# Patient Record
Sex: Female | Born: 1955 | Race: Black or African American | Hispanic: No | Marital: Married | Smoking: Former smoker
Health system: Southern US, Community
[De-identification: ages and names within clinical notes are randomized; demographics above are authoritative.]

## PROBLEM LIST (undated history)

## (undated) DIAGNOSIS — I10 Essential (primary) hypertension: Secondary | ICD-10-CM

## (undated) DIAGNOSIS — K219 Gastro-esophageal reflux disease without esophagitis: Secondary | ICD-10-CM

## (undated) DIAGNOSIS — E119 Type 2 diabetes mellitus without complications: Secondary | ICD-10-CM

## (undated) DIAGNOSIS — M1990 Unspecified osteoarthritis, unspecified site: Secondary | ICD-10-CM

## (undated) DIAGNOSIS — D869 Sarcoidosis, unspecified: Secondary | ICD-10-CM

## (undated) DIAGNOSIS — I259 Chronic ischemic heart disease, unspecified: Secondary | ICD-10-CM

## (undated) HISTORY — DX: Chronic ischemic heart disease, unspecified: I25.9

## (undated) HISTORY — PX: COLONOSCOPY: SHX174

## (undated) HISTORY — PX: CARDIAC CATHETERIZATION: SHX172

## (undated) HISTORY — DX: Essential (primary) hypertension: I10

## (undated) HISTORY — DX: Sarcoidosis, unspecified: D86.9

---

## 1999-10-20 ENCOUNTER — Encounter: Admission: RE | Admit: 1999-10-20 | Discharge: 1999-10-20 | Payer: Self-pay | Admitting: Internal Medicine

## 2004-05-29 ENCOUNTER — Ambulatory Visit: Payer: Self-pay | Admitting: Family Medicine

## 2004-06-11 ENCOUNTER — Ambulatory Visit: Payer: Self-pay | Admitting: *Deleted

## 2004-06-25 ENCOUNTER — Ambulatory Visit: Payer: Self-pay | Admitting: *Deleted

## 2004-06-25 ENCOUNTER — Ambulatory Visit (HOSPITAL_COMMUNITY): Admission: RE | Admit: 2004-06-25 | Discharge: 2004-06-25 | Payer: Self-pay | Admitting: *Deleted

## 2004-07-01 ENCOUNTER — Ambulatory Visit: Payer: Self-pay | Admitting: *Deleted

## 2004-10-08 ENCOUNTER — Ambulatory Visit: Payer: Self-pay | Admitting: Internal Medicine

## 2004-11-11 ENCOUNTER — Ambulatory Visit: Payer: Self-pay | Admitting: Family Medicine

## 2005-01-04 ENCOUNTER — Ambulatory Visit: Payer: Self-pay | Admitting: Internal Medicine

## 2005-02-02 ENCOUNTER — Ambulatory Visit: Payer: Self-pay | Admitting: Internal Medicine

## 2005-06-24 ENCOUNTER — Ambulatory Visit: Payer: Self-pay | Admitting: Family Medicine

## 2005-07-01 ENCOUNTER — Ambulatory Visit: Payer: Self-pay | Admitting: Family Medicine

## 2005-07-07 ENCOUNTER — Ambulatory Visit: Payer: Self-pay | Admitting: Family Medicine

## 2005-07-14 ENCOUNTER — Ambulatory Visit: Payer: Self-pay | Admitting: Family Medicine

## 2005-10-11 ENCOUNTER — Ambulatory Visit: Payer: Self-pay | Admitting: Internal Medicine

## 2006-10-03 ENCOUNTER — Ambulatory Visit: Payer: Self-pay | Admitting: Family Medicine

## 2006-10-28 ENCOUNTER — Ambulatory Visit: Payer: Self-pay | Admitting: Family Medicine

## 2007-05-17 DIAGNOSIS — J310 Chronic rhinitis: Secondary | ICD-10-CM | POA: Insufficient documentation

## 2007-05-17 DIAGNOSIS — I2510 Atherosclerotic heart disease of native coronary artery without angina pectoris: Secondary | ICD-10-CM | POA: Insufficient documentation

## 2007-05-17 DIAGNOSIS — D869 Sarcoidosis, unspecified: Secondary | ICD-10-CM

## 2007-06-14 ENCOUNTER — Ambulatory Visit: Payer: Self-pay | Admitting: Internal Medicine

## 2007-06-14 DIAGNOSIS — M2550 Pain in unspecified joint: Secondary | ICD-10-CM | POA: Insufficient documentation

## 2007-06-14 LAB — CONVERTED CEMR LAB
ALT: 12 units/L (ref 0–35)
AST: 17 units/L (ref 0–37)
Albumin: 4.1 g/dL (ref 3.5–5.2)
Alkaline Phosphatase: 67 units/L (ref 39–117)
BUN: 10 mg/dL (ref 6–23)
Bilirubin, Direct: 0.2 mg/dL (ref 0.0–0.3)
CO2: 31 meq/L (ref 19–32)
Calcium: 9.2 mg/dL (ref 8.4–10.5)
Chloride: 104 meq/L (ref 96–112)
Creatinine, Ser: 0.8 mg/dL (ref 0.4–1.2)
GFR calc Af Amer: 97 mL/min
GFR calc non Af Amer: 80 mL/min
Glucose, Bld: 96 mg/dL (ref 70–99)
Potassium: 3.6 meq/L (ref 3.5–5.1)
Sed Rate: 17 mm/hr (ref 0–25)
Sodium: 141 meq/L (ref 135–145)
Total Bilirubin: 0.8 mg/dL (ref 0.3–1.2)
Total Protein: 7.7 g/dL (ref 6.0–8.3)

## 2007-08-01 ENCOUNTER — Ambulatory Visit: Payer: Self-pay | Admitting: Internal Medicine

## 2007-08-02 LAB — CONVERTED CEMR LAB
ALT: 15 units/L (ref 0–35)
AST: 16 units/L (ref 0–37)
Albumin: 3.7 g/dL (ref 3.5–5.2)

## 2008-11-04 ENCOUNTER — Ambulatory Visit: Payer: Self-pay | Admitting: Internal Medicine

## 2008-11-04 LAB — CONVERTED CEMR LAB
Albumin: 3.8 g/dL (ref 3.5–5.2)
Alkaline Phosphatase: 44 units/L (ref 39–117)
BUN: 8 mg/dL (ref 6–23)
Basophils Absolute: 0 10*3/uL (ref 0.0–0.1)
Bilirubin, Direct: 0.2 mg/dL (ref 0.0–0.3)
CO2: 32 meq/L (ref 19–32)
Calcium: 9.1 mg/dL (ref 8.4–10.5)
Creatinine, Ser: 0.7 mg/dL (ref 0.4–1.2)
Eosinophils Absolute: 0.1 10*3/uL (ref 0.0–0.7)
Glucose, Bld: 103 mg/dL — ABNORMAL HIGH (ref 70–99)
Lymphocytes Relative: 13.2 % (ref 12.0–46.0)
MCHC: 34.6 g/dL (ref 30.0–36.0)
Neutrophils Relative %: 80.6 % — ABNORMAL HIGH (ref 43.0–77.0)
RDW: 12.8 % (ref 11.5–14.6)

## 2009-01-07 ENCOUNTER — Ambulatory Visit: Payer: Self-pay | Admitting: Internal Medicine

## 2009-04-11 ENCOUNTER — Ambulatory Visit: Payer: Self-pay | Admitting: Internal Medicine

## 2009-07-29 ENCOUNTER — Ambulatory Visit: Payer: Self-pay | Admitting: Internal Medicine

## 2009-07-30 LAB — CONVERTED CEMR LAB
ALT: 14 units/L (ref 0–35)
AST: 18 units/L (ref 0–37)
Albumin: 4 g/dL (ref 3.5–5.2)
CO2: 30 meq/L (ref 19–32)
Calcium: 9.1 mg/dL (ref 8.4–10.5)
GFR calc non Af Amer: 95.84 mL/min (ref >60)
Sed Rate: 8 mm/hr (ref 0–22)
Sodium: 143 meq/L (ref 135–145)
Total Protein: 7.1 g/dL (ref 6.0–8.3)

## 2009-09-10 ENCOUNTER — Ambulatory Visit: Payer: Self-pay | Admitting: Internal Medicine

## 2009-09-10 ENCOUNTER — Telehealth (INDEPENDENT_AMBULATORY_CARE_PROVIDER_SITE_OTHER): Payer: Self-pay | Admitting: *Deleted

## 2009-10-13 ENCOUNTER — Ambulatory Visit: Payer: Self-pay | Admitting: Internal Medicine

## 2009-11-12 ENCOUNTER — Ambulatory Visit: Payer: Self-pay | Admitting: Internal Medicine

## 2009-11-13 LAB — CONVERTED CEMR LAB
AST: 22 units/L (ref 0–37)
Alkaline Phosphatase: 45 units/L (ref 39–117)
Bilirubin, Direct: 0.1 mg/dL (ref 0.0–0.3)
Calcium: 9 mg/dL (ref 8.4–10.5)
GFR calc non Af Amer: 81.48 mL/min (ref >60)
Potassium: 4 meq/L (ref 3.5–5.1)
Sodium: 142 meq/L (ref 135–145)
Total Bilirubin: 0.4 mg/dL (ref 0.3–1.2)

## 2010-02-20 ENCOUNTER — Ambulatory Visit: Payer: Self-pay | Admitting: Internal Medicine

## 2010-02-23 ENCOUNTER — Telehealth (INDEPENDENT_AMBULATORY_CARE_PROVIDER_SITE_OTHER): Payer: Self-pay | Admitting: *Deleted

## 2010-02-23 LAB — CONVERTED CEMR LAB
ALT: 19 units/L (ref 0–35)
AST: 23 units/L (ref 0–37)
BUN: 9 mg/dL (ref 6–23)
CO2: 30 meq/L (ref 19–32)
Chloride: 104 meq/L (ref 96–112)
Creatinine, Ser: 0.8 mg/dL (ref 0.4–1.2)
Potassium: 4.2 meq/L (ref 3.5–5.1)
Sed Rate: 9 mm/hr (ref 0–22)
Total Protein: 6.7 g/dL (ref 6.0–8.3)

## 2010-04-08 ENCOUNTER — Ambulatory Visit: Payer: Self-pay | Admitting: Internal Medicine

## 2010-04-09 LAB — CONVERTED CEMR LAB
CO2: 35 meq/L — ABNORMAL HIGH (ref 19–32)
Calcium: 9.4 mg/dL (ref 8.4–10.5)
Creatinine, Ser: 0.8 mg/dL (ref 0.4–1.2)
GFR calc non Af Amer: 91.62 mL/min (ref >60)
Glucose, Bld: 95 mg/dL (ref 70–99)
Sodium: 144 meq/L (ref 135–145)

## 2010-05-27 ENCOUNTER — Ambulatory Visit: Payer: Self-pay | Admitting: Internal Medicine

## 2010-07-14 ENCOUNTER — Ambulatory Visit: Admit: 2010-07-14 | Payer: Self-pay | Admitting: Internal Medicine

## 2010-07-15 ENCOUNTER — Telehealth: Payer: Self-pay | Admitting: Internal Medicine

## 2010-07-26 LAB — CONVERTED CEMR LAB
ALT: 13 units/L (ref 0–35)
BUN: 9 mg/dL (ref 6–23)
Bilirubin, Direct: 0.1 mg/dL (ref 0.0–0.3)
CO2: 29 meq/L (ref 19–32)
GFR calc non Af Amer: 96.04 mL/min (ref >60)
Glucose, Bld: 94 mg/dL (ref 70–99)
Potassium: 3.1 meq/L — ABNORMAL LOW (ref 3.5–5.1)
Total Bilirubin: 1 mg/dL (ref 0.3–1.2)

## 2010-07-30 NOTE — Assessment & Plan Note (Signed)
Summary: Pulmonary/  f/u sarcoidosis, try 15 mg per day   Primary Provider/Referring Provider:  Nyland  CC:  6 wk followup on sarcoid. Pt states that her joint pain has improved since last seen.  Marland Kitchen  History of Present Illness: 55  yobf  remote smoker with sarcoidosis chronically that has been prednisone dependent since 1997.Marland Kitchen  Historically  when  tapers of prednisone below 10 mg daily she has a flare up of either arthritis or cough/sob.   2/09 last ov stopped plaquenil  ? why? when?  Nov 04, 2008 ov cc sev months right foot pain over heads of MT's worse with weight bearing worse as day goes on...on 10 mg pred one half daily sometimes joints ache and takes a whole and all better, can't tolerate nsaids due to gi upset, even advil.  no cough or sob  rec initiate plaquenil  January 07, 2009 ov taking plaquenil daily @ 200 mg. able to decrease prednisone down to 10 mg  daily with no flare of arthitis, cough, sob, occular or articular c/os  LFT's ok,  rec that she try one half on odd days and continue 200 mg daily of plaquenil  April 11, 2009 ov still on plaqenil and able to maintain prednisone 10 mg one half daily but occasionally misses dose and no change in symtpoms 3 mo follow up.   no ocular or articular symptoms or rash so try 10 mg one half daily  July 29, 2009 Followup.  Pt states that she is doing well and denies any complaints of arthritis, sob cough or increase rash. try every other day 10mg  one half.  September 10, 2009 6 wk followup after sev weeks on 5 mg qod   Pt states that her her breathing has been fine.  She c/.o occ dry cough and c/o increased pain in her joints and rash on right ankle.  rec double plaquenil 200mg   twice daily and reduce pred to 5 mg q 3 d  > rash resolved, cough gone, breath, joints achy but no worse  October 13, 2009  4 wk followup.  Pt states that her joint pain is the same- no better.    Nov 12, 2009 ov no change joints on or off prednisone, no cough or sob.    see page 2 February 20, 2010  cc  increased joint pain- esp in hands and lower back since stopped prednisone.  Breathing is fine and states no cough.    cannot take nsaids, no better with tylenol >   rec Prednisone 10 mg 2 daily until better, then one daily until next visit  Hold simvastatin (cholesterol medication) for the next 6 weeks Stopped plaquenil after convinced tingling and resolved.  April 08, 2010 ov cc joint pain resolved 100% on 20  and returned 50% on 10 mg daily off plaquenil and also now has " cramps" in back of thighs better  if stretch out or walk it out,  only one week seems worse at bedtime.  no cough or sob.  Pt denies any significant sore throat, dysphagia, itching, sneezing,  nasal congestion or excess secretions,  fever, chills, sweats, unintended wt loss, pleuritic or exertional cp, hempoptysis, change in activity tolerance  orthopnea pnd or leg swelling, no rash, articular or occular c/os  Current Medications (verified): 1)  Losartan Potassium 50 Mg Tabs (Losartan Potassium) .Marland Kitchen.. 1 Once Daily 2)  Womens One Daily   Tabs (Multiple Vitamins-Minerals) .... Once Daily 3)  Caltrate 600+d 600-400  Mg-Unit  Tabs (Calcium Carbonate-Vitamin D) .... Two Times A Day 4)  Alendronate Sodium 70 Mg  Tabs (Alendronate Sodium) .... Once Weekly On "Sundays 5)  Simvastatin 20 Mg Tabs (Simvastatin) .... Try Off Until All Aches Gone Not Taking 6)  Prilosec Otc 20 Mg Tbec (Omeprazole Magnesium) .... 1 Once Daily 7)  Diprolene 0.05 % Oint (Betamethasone Dipropionate Aug) .... Apply Daily, Not For Use On Face 8)  Bayer Childrens Aspirin 81 Mg Chew (Aspirin) .... One Daily 9)  Plaquenil 200 Mg Tabs (Hydroxychloroquine Sulfate) .... 1 Two Times A Day - Stopped Taking Due To Numbness 10)  Tylenol 325 Mg Tabs (Acetaminophen) .... Per Bottle Directions As Needed 11)  Prednisone 10 Mg  Tabs (Prednisone) .... 2 Daily Until Better Then 1 Daily  Allergies (verified): 1)  ! Sulfa 2)  !  Ibuprofen  Past History:  Past Medical History: IHD  -left heart catheter 4/94 and 95% LAD reduced to 10% with PCA  -R./S. Myoview negative for ischemia ejection fraction 67% 06/25/04 HBP SARCOIDOSIS.....................................................................................Wert    - Prednisone dep since 1997     - Plaquenil rx 12/08 > stopped ? when ? why    - Plaquenil restarted Nov 04, 2008 >  increase to 200 two times a day 09/10/09 > d/c  02/23/10 100% resolved    - PFT's January 07, 2009 FVC 97% DLC0 66%    - Try off prednisone October 13, 2009 > worse aches February 20, 2010 so restarted   Vital Signs:  Patient profile:   55 year old female female Weight:      201.56 pounds O2 Sat:      98 % on Room air Temp:     98.0 degrees F oral Pulse rate:   66 / minute BP sitting:   118 / 82  (left arm)  Vitals Entered By: Leslie Raskin (April 08, 2010 9:28 AM)  O2 Flow:  Room air  Physical Exam  Additional Exam:  wt 205 Nov 04, 2008   > 212 October 13, 2009 > 206 Nov 12, 2009 > 203 February 20, 2010 > 201 April 08, 2010  Ambulatory healthy obese black female in no acute distress. HEENT: nl dentition, turbinates, and orophanx. Nl external ear canals without cough reflex Neck without JVD/Nodes/TM Lungs clear to A and P bilaterally without cough on insp or exp maneuvers RRR no s3 or murmur or increase in P2 Abd soft and benign with nl excursion in the supine position. No bruits or organomegaly Ext warm without calf tenderness, cyanosis clubbing or edema Skin warm and dry with no residual rash MS  nl gait, no joint restrictions or swelling noted     Sodium                    144 mEq/L                   135-145   Potassium                 4.1 mEq/L                   3.5-5.1   Chloride                  103 mEq/L                   96" -112   Carbon Dioxide       [H]  35 mEq/L  19-32   Glucose                   95 mg/dL                    11-91   BUN                        11 mg/dL                    4-78   Creatinine                0.8 mg/dL                   2.9-5.6   Calcium                   9.4 mg/dL                   2.1-30.8   GFR                       91.62 mL/min                >60  Impression & Recommendations:  Problem # 1:  PULMONARY SARCOIDOSIS (ICD-135) Apparently intolerant of plaquenil   The goal with a chronic steroid dependent illness is always arriving at the lowest effective dose that controls the disease/symptoms and not accepting a set "formula" which is based on statistics that don't take into accound individual variability or the natural hx of the dz in every individual patient, which may well vary over time.  It appears "0" is not an adequate  baseline for control of the arthritis component although it did eliminate the rash and she's had no pulmonary flare and not convinced the aches and pains are even related to sarcoid (see Prob #2)  Try new ceiling of 20 and floor 15 mg per day  Each maintenance medication was reviewed in detail including most importantly the difference between maintenance and as needed and under what circumstances the prns are to be used.   Problem # 2:  ARTHRALGIA (ICD-719.40)  Prob sarcoid though many symtoms atypical so stay off statins for now to establish baseline then ? rechallenge later  Orders: Est. Patient Level III (65784)  Medications Added to Medication List This Visit: 1)  Losartan Potassium 50 Mg Tabs (Losartan potassium) .Marland Kitchen.. 1 once daily 2)  Simvastatin 20 Mg Tabs (Simvastatin) .... Try off until all aches gone not taking 3)  Prilosec Otc 20 Mg Tbec (Omeprazole magnesium) .... Take  one 30-60 min before first meal of the day 4)  Prednisone 10 Mg Tabs (Prednisone) .... 2 daily until better then 1 daily 5)  Prednisone 10 Mg Tabs (Prednisone) .... 2 daily until better then  1 and half daily 6)  Plaquenil 200 Mg Tabs (Hydroxychloroquine sulfate) .Marland Kitchen.. 1 two times a day - stopped taking due to  numbness  Other Orders: TLB-BMP (Basic Metabolic Panel-BMET) (80048-METABOL)  Patient Instructions: 1)  ok to stay off plaquenil and simvostatin 2)  Prednisone is 10mg  2 daily until better then one and a half 3)  Tonic water at betime for cramps 4)  Please schedule a follow-up appointment in 6 weeks, sooner if needed  5)    Prescriptions: PREDNISONE 10 MG  TABS (PREDNISONE) 2 daily until better then  1 and half daily  #100 x 3  Entered and Authorized by:   Nyoka Cowden MD   Signed by:   Nyoka Cowden MD on 04/08/2010   Method used:   Electronically to        [facility] [phone]* (retail)       [address]       [place], [place]  [address]       Ph: [phone] or [phone]       Fax: [phone]   RxID:   [id number]

## 2010-07-30 NOTE — Assessment & Plan Note (Signed)
Summary: Pulmonary/ f/u ov off prednisone x 1 month   Primary Provider/Referring Provider:  Nyland  CC:  4 wk followup. Pt states that her joint pain is about the same- maybe some worse in her lower back and feet.  She has occ cough when exercises.  .  History of Present Illness: 79  yobf  remote smoker with sarcoidosis chronically that has been prednisone dependent since 1997.Marland Kitchen  Historically  when  tapers of prednisone below 10 mg daily she has a flare up of either arthritis or cough/sob.   2/09 last ov stopped plaquenil  ? why? when?  Nov 04, 2008 ov cc sev months right foot pain over heads of MT's worse with weight bearing worse as day goes on...on 10 mg pred one half daily sometimes joints ache and takes a whole and all better, can't tolerate nsaids due to gi upset, even advil.  no cough or sob  rec initiate plaquenil  January 07, 2009 ov taking plaquenil daily @ 200 mg. able to decrease prednisone down to 10 mg  daily with no flare of arthitis, cough, sob, occular or articular c/os  LFT's ok,  rec that she try one half on odd days and continue 200 mg daily of plaquenil  April 11, 2009 ov still on plaqenil and able to maintain prednisone 10 mg one half daily but occasionally misses dose and no change in symtpoms 3 mo follow up.   no ocular or articular symptoms or rash so try 10 mg one half daily  July 29, 2009 Followup.  Pt states that she is doing well and denies any complaints of arthritis, sob cough or increase rash. try every other day 10mg  one half.  September 10, 2009 6 wk followup after sev weeks on 5 mg qod   Pt states that her her breathing has been fine.  She c/.o occ dry cough and c/o increased pain in her joints and rash on right ankle.  rec double plaquenil 200mg   twice daily and reduce pred to 5 mg q 3 d  > rash resolved, cough gone, breath, joints achy but no worse  October 13, 2009  4 wk followup.  Pt states that her joint pain is the same- no better.    Nov 12, 2009 ov no  change joints on or off prednisone, no cough or sob. Pt denies any significant sore throat, dysphagia, itching, sneezing,  nasal congestion or excess secretions,  fever, chills, sweats, unintended wt loss, pleuritic or exertional cp, hempoptysis, change in activity tolerance  orthopnea pnd or leg swelling   Current Medications (verified): 1)  Estradiol 1 Mg  Tabs (Estradiol) .... Once Daily 2)  Benicar Hct 20-12.5 Mg  Tabs (Olmesartan Medoxomil-Hctz) .... Take 1 Tablet By Mouth Once A Day 3)  Womens One Daily   Tabs (Multiple Vitamins-Minerals) .... Once Daily 4)  Caltrate 600+d 600-400 Mg-Unit  Tabs (Calcium Carbonate-Vitamin D) .... Two Times A Day 5)  Alendronate Sodium 70 Mg  Tabs (Alendronate Sodium) .... Once Weekly On Sundays 6)  Simvastatin 20 Mg Tabs (Simvastatin) .Marland Kitchen.. 1 Once Daily 7)  Aciphex 20 Mg Tbec (Rabeprazole Sodium) .Marland Kitchen.. 1 Once Daily 8)  Medroxyprogesterone Acetate 5 Mg Tabs (Medroxyprogesterone Acetate) .Marland Kitchen.. 1 Once Daily 9)  Amlodipine Besylate 2.5 Mg Tabs (Amlodipine Besylate) .Marland Kitchen.. 1 Once Daily 10)  Diprolene 0.05 % Oint (Betamethasone Dipropionate Aug) .... Apply Daily, Not For Use On Face 11)  Bayer Childrens Aspirin 81 Mg Chew (Aspirin) .... One Daily  Allergies (  verified): 1)  ! Sulfa 2)  ! Ibuprofen  Past History:  Past Medical History: IHD  -left heart catheter 4/94 and 95% LAD reduced to 10% with PCA  -R./S. Myoview negative for ischemia ejection fraction 67% 06/25/04 HBP SARCOIDOSIS....................................................................................Marland KitchenWert    - Prednisone dep since 1997 > every other day dosing started July 29, 2009     - Plaquenil rx 12/08 > stopped ? when ? why    - Plaquenil restarted Nov 04, 2008 >  increase to 200 two times a day 09/10/09    - PFT's January 07, 2009 FVC 97% DLC0 66%    - Try off prednisone October 13, 2009   Vital Signs:  Patient profile:   55 year old female Weight:      206 pounds O2 Sat:      98 %  on Room air Temp:     98.1 degrees F oral Pulse rate:   69 / minute BP sitting:   108 / 74  (left arm) Cuff size:   large  Vitals Entered By: Vernie Murders (Nov 12, 2009 9:31 AM)  O2 Flow:  Room air  Physical Exam  Additional Exam:  wt 205 Nov 04, 2008 > 204 January 07, 2009 >  211 July 29, 2009 >   213 September 10, 2009 > 212 October 13, 2009 > 206 Nov 12, 2009  Ambulatory healthy obese black female in no acute distress. HEENT: nl dentition, turbinates, and orophanx. Nl external ear canals without cough reflex Neck without JVD/Nodes/TM Lungs clear to A and P bilaterally without cough on insp or exp maneuvers RRR no s3 or murmur or increase in P2 Abd soft and benign with nl excursion in the supine position. No bruits or organomegaly Ext warm without calf tenderness, cyanosis clubbing or edema Skin warm and dry with minimal (< dime sized)  violaceous plaques no worse   Sodium                    142 mEq/L                   135-145   Potassium                 4.0 mEq/L                   3.5-5.1   Chloride                  105 mEq/L                   96-112   Carbon Dioxide            28 mEq/L                    19-32   Glucose                   93 mg/dL                    16-10   BUN                       15 mg/dL                    9-60   Creatinine                0.9 mg/dL  0.4-1.2   Calcium                   9.0 mg/dL                   4.0-98.1   GFR                       81.48 mL/min                >60  Tests: (2) Hepatic/Liver Function Panel (HEPATIC)   Total Bilirubin           0.4 mg/dL                   1.9-1.4   Direct Bilirubin          0.1 mg/dL                   7.8-2.9   Alkaline Phosphatase      45 U/L                      39-117   AST                       22 U/L                      0-37   ALT                       18 U/L                      0-35   Total Protein             7.0 g/dL                    5.6-2.1   Albumin                   4.3 g/dL                     3.0-8.6  Tests: (3) Sed Rate (ESR)   Sed Rate                  12 mm/hr                    0-22  CXR  Procedure date:  11/12/2009  Findings:      Comparison: 11/04/2008 and 06/14/2007   Findings: Trachea is midline.  Heart size normal.  Added density and volume loss are seen in the medial right lung apex.  There is bihilar retraction.  Appearance is unchanged from 06/14/2007. Lungs are otherwise clear.  No pleural fluid.   IMPRESSION: Biapical nodularity and volume loss with bihilar retraction, consistent with the given history of sarcoid.  No interval change from 06/14/2007.    Impression & Recommendations:  Problem # 1:  PULMONARY SARCOIDOSIS (ICD-135) Tolerating off pred x one month without flare   The goal with a chronic steroid dependent illness is always arriving at the lowest effective dose that controls the disease/symptoms and not accepting a set "formula" which is based on statistics that don't take into accound individual variability or the natural hx of the dz in every individual patient, which may well vary over time.  For now will try to keep entirrely off  off prednisone  on high dose plaquenil  Will need ocular exam due to sarcoid and plaquaenil   Each maintenance medication was reviewed in detail including most importantly the difference between maintenance and as needed and under what circumstances the prns are to be used.   Other Orders: T-2 View CXR (71020TC) TLB-BMP (Basic Metabolic Panel-BMET) (80048-METABOL) TLB-Hepatic/Liver Function Pnl (80076-HEPATIC) TLB-Sedimentation Rate (ESR) (85652-ESR) Est. Patient Level III (29562)  Patient Instructions: 1)  Return to office in 3 months, sooner if needed    CXR  Procedure date:  11/12/2009  Findings:      Comparison: 11/04/2008 and 06/14/2007   Findings: Trachea is midline.  Heart size normal.  Added density and volume loss are seen in the medial right lung apex.  There is bihilar  retraction.  Appearance is unchanged from 06/14/2007. Lungs are otherwise clear.  No pleural fluid.   IMPRESSION: Biapical nodularity and volume loss with bihilar retraction, consistent with the given history of sarcoid.  No interval change from 06/14/2007.

## 2010-07-30 NOTE — Assessment & Plan Note (Signed)
Summary: Pulmonary/ ext ov, aches worse, try off statin and on pred   Primary Provider/Referring Provider:  Nyland  CC:  3 month followup.  Pt c/o increased joint pain- esp in hands and lower back since stopped prednisone.  Breathing is fine and states no cough..  History of Present Illness: 48  yobf  remote smoker with sarcoidosis chronically that has been prednisone dependent since 1997.Marland Kitchen  Historically  when  tapers of prednisone below 10 mg daily she has a flare up of either arthritis or cough/sob.   2/09 last ov stopped plaquenil  ? why? when?  Nov 04, 2008 ov cc sev months right foot pain over heads of MT's worse with weight bearing worse as day goes on...on 10 mg pred one half daily sometimes joints ache and takes a whole and all better, can't tolerate nsaids due to gi upset, even advil.  no cough or sob  rec initiate plaquenil  January 07, 2009 ov taking plaquenil daily @ 200 mg. able to decrease prednisone down to 10 mg  daily with no flare of arthitis, cough, sob, occular or articular c/os  LFT's ok,  rec that she try one half on odd days and continue 200 mg daily of plaquenil  April 11, 2009 ov still on plaqenil and able to maintain prednisone 10 mg one half daily but occasionally misses dose and no change in symtpoms 3 mo follow up.   no ocular or articular symptoms or rash so try 10 mg one half daily  July 29, 2009 Followup.  Pt states that she is doing well and denies any complaints of arthritis, sob cough or increase rash. try every other day 10mg  one half.  September 10, 2009 6 wk followup after sev weeks on 5 mg qod   Pt states that her her breathing has been fine.  She c/.o occ dry cough and c/o increased pain in her joints and rash on right ankle.  rec double plaquenil 200mg   twice daily and reduce pred to 5 mg q 3 d  > rash resolved, cough gone, breath, joints achy but no worse  October 13, 2009  4 wk followup.  Pt states that her joint pain is the same- no better.    Nov 12, 2009 ov no change joints on or off prednisone, no cough or sob.   see page 2 February 20, 2010  cc  increased joint pain- esp in hands and lower back since stopped prednisone.  Breathing is fine and states no cough. Pt denies any significant sore throat, dysphagia, itching, sneezing,  nasal congestion or excess secretions,  fever, chills, sweats, unintended wt loss, pleuritic or exertional cp, hempoptysis, change in activity tolerance  orthopnea pnd or leg swelling, no cough.  Pt also denies any obvious fluctuation in symptoms with weather or environmental change or other alleviating or aggravating factors.     cannot take nsaids, no better with tylenol  Current Medications (verified): 1)  Benicar Hct 20-12.5 Mg  Tabs (Olmesartan Medoxomil-Hctz) .... Take 1 Tablet By Mouth Once A Day 2)  Womens One Daily   Tabs (Multiple Vitamins-Minerals) .... Once Daily 3)  Caltrate 600+d 600-400 Mg-Unit  Tabs (Calcium Carbonate-Vitamin D) .... Two Times A Day 4)  Alendronate Sodium 70 Mg  Tabs (Alendronate Sodium) .... Once Weekly On Sundays 5)  Simvastatin 20 Mg Tabs (Simvastatin) .Marland Kitchen.. 1 Once Daily 6)  Prilosec Otc 20 Mg Tbec (Omeprazole Magnesium) .Marland Kitchen.. 1 Once Daily 7)  Diprolene 0.05 % Oint (  Betamethasone Dipropionate Aug) .... Apply Daily, Not For Use On Face 8)  Bayer Childrens Aspirin 81 Mg Chew (Aspirin) .... One Daily 9)  Plaquenil 200 Mg Tabs (Hydroxychloroquine Sulfate) .Marland Kitchen.. 1 Two Times A Day 10)  Tylenol 325 Mg Tabs (Acetaminophen) .... Per Bottle Directions As Needed  Allergies (verified): 1)  ! Sulfa 2)  ! Ibuprofen  Past History:  Past Medical History: IHD  -left heart catheter 4/94 and 95% LAD reduced to 10% with PCA  -R./S. Myoview negative for ischemia ejection fraction 67% 06/25/04 HBP SARCOIDOSIS....................................................................................Marland KitchenWert    - Prednisone dep since 1997 > every other day dosing started July 29, 2009     - Plaquenil rx  12/08 > stopped ? when ? why    - Plaquenil restarted Nov 04, 2008 >  increase to 200 two times a day 09/10/09    - PFT's January 07, 2009 FVC 97% DLC0 66%    - Try off prednisone October 13, 2009 > worse aches February 20, 2010 so restart trial  Vital Signs:  Patient profile:   55 year old female Weight:      203 pounds BMI:     33.90 O2 Sat:      98 % on Room air Temp:     98.1 degrees F oral Pulse rate:   58 / minute BP sitting:   112 / 70  (left arm)  Vitals Entered By: Vernie Murders (February 20, 2010 9:28 AM)  O2 Flow:  Room air  Physical Exam  Additional Exam:  wt 205 Nov 04, 2008 >   211 July 29, 2009 >   213 September 10, 2009 > 212 October 13, 2009 > 206 Nov 12, 2009 > 203 February 20, 2010  Ambulatory healthy obese black female in no acute distress. HEENT: nl dentition, turbinates, and orophanx. Nl external ear canals without cough reflex Neck without JVD/Nodes/TM Lungs clear to A and P bilaterally without cough on insp or exp maneuvers RRR no s3 or murmur or increase in P2 Abd soft and benign with nl excursion in the supine position. No bruits or organomegaly Ext warm without calf tenderness, cyanosis clubbing or edema Skin warm and dry with no residual rash MS  nl gait, no joint restrictions or swelling noted   Sodium                    142 mEq/L                   135-145   Potassium                 4.2 mEq/L                   3.5-5.1   Chloride                  104 mEq/L                   96-112   Carbon Dioxide            30 mEq/L                    19-32   Glucose                   91 mg/dL                    16-10   BUN  9 mg/dL                     1-19   Creatinine                0.8 mg/dL                   1.4-7.8   Calcium                   9.4 mg/dL                   2.9-56.2   GFR                       97.04 mL/min                >60  Tests: (2) Hepatic/Liver Function Panel (HEPATIC)   Total Bilirubin           0.6 mg/dL                    1.3-0.8   Direct Bilirubin          0.1 mg/dL                   6.5-7.8   Alkaline Phosphatase      49 U/L                      39-117   AST                       23 U/L                      0-37   ALT                       19 U/L                      0-35   Total Protein             6.7 g/dL                    4.6-9.6   Albumin                   4.1 g/dL                    2.9-5.2  Tests: (3) Sed Rate (ESR)   Sed Rate                  9 mm/hr                     0-22  Impression & Recommendations:  Problem # 1:  PULMONARY SARCOIDOSIS (ICD-135) The goal with a chronic steroid dependent illness is always arriving at the lowest effective dose that controls the disease/symptoms and not accepting a set "formula" which is based on statistics that don't take into accound individual variability or the natural hx of the dz in every individual patient, which may well vary over time.  It appears "0" is not an adequate  baseline for control of the arthritis component although it did eliminate the rash and she's had no pulmonary flare and not convinced the aches and pains are even related to sarcoid (see Prob #2)  Try new ceiling  of 20 and floor 10 mg daily for now  Each maintenance medication was reviewed in detail including most importantly the difference between maintenance and as needed and under what circumstances the prns are to be used.   Problem # 2:  ARTHRALGIA (ICD-719.40)  some of her aches and pains may be related to statins, try off x 6 weeks     Medications Added to Medication List This Visit: 1)  Simvastatin 20 Mg Tabs (Simvastatin) .... Try off until all aches gone 2)  Prilosec Otc 20 Mg Tbec (Omeprazole magnesium) .Marland Kitchen.. 1 once daily 3)  Plaquenil 200 Mg Tabs (Hydroxychloroquine sulfate) .Marland Kitchen.. 1 two times a day 4)  Tylenol 325 Mg Tabs (Acetaminophen) .... Per bottle directions as needed 5)  Prednisone 10 Mg Tabs (Prednisone) .... 2 daily until better then 1 daily  Other  Orders: Est. Patient Level IV ([address]) TLB-BMP (Basic Metabolic Panel-BMET) (80048-METABOL) TLB-Hepatic/Liver Function Pnl (80076-HEPATIC) TLB-Sedimentation Rate (ESR) (85652-ESR)  Patient Instructions: 1)  Prednisone 10 mg 2 daily until better, then one daily until next visit 2)  Please schedule a follow-up appointment in 6 weeks, sooner if needed  3)  Hold simvastatin (cholesterol medication) for the next 6 weeks Prescriptions: PREDNISONE 10 MG  TABS (PREDNISONE) 2 daily until better then 1 daily  #100 x 0   Entered and Authorized by:   Nyoka Cowden MD   Signed by:   Nyoka Cowden MD on 02/20/2010   Method used:   [facility] to        [facility] [phone]* (retail)       [address]       [place], [place]  [address]       Ph: [phone] or [phone]       Fax: [phone]   RxID:   [id number]

## 2010-07-30 NOTE — Progress Notes (Signed)
Summary: ? plaquenil side effects > try off until 04-08-10 OV  Phone Note Call from Patient   Caller: Patient Call For: Wert Reason for Call: Talk to Nurse, Talk to Doctor Summary of Call: Pt is c/o plaquenil causing tingling in her fingers and hands.  She states that she only notices symptoms shortly after taking this med.  She would like to know if she should stop taking to see if tingling goes away.  Pls advise, thanks! Initial call taken by: Vernie Murders,  February 23, 2010 8:51 AM  Follow-up for Phone Call        that's fine, try off completely until next ov Follow-up by: Nyoka Cowden MD,  February 23, 2010 11:59 AM  Additional Follow-up for Phone Call Additional follow up Details #1::        LMOMTCB Vernie Murders  February 23, 2010 2:18 PM  called spoke with patient, advised of MW's recs as stated above.  pt verbalized her understanding and will hold the plaquenil until next OV 04-08-10.  "hold" has been placed beside this med on her med list. Boone Master CNA/MA  February 24, 2010 10:57 AM     New/Updated Medications: PLAQUENIL 200 MG TABS (HYDROXYCHLOROQUINE SULFATE) 1 two times a day - HOLD until seen by [facility] 04-08-10

## 2010-07-30 NOTE — Assessment & Plan Note (Signed)
Summary: Pulmonary/ f/u sarcoid, try qod dosing    Primary Provider/Referring Provider:  Nyland  CC:  Followup.  Pt states that she is doing well and denies any complaints today.Marland Kitchen  History of Present Illness: 81 yobf  remote smoker with sarcoidosis chronically that has been prednisone dependent since 1997.Marland Kitchen  Historicall when  tapers of prednisone below 10 mg daily she has a flare up of either arthritis or cough/sob.   2/09 last ov stopped plaquenil  ? why? when?  Nov 04, 2008 ov cc sev months right foot pain over heads of MT's worse with weight bearing worse as day goes on...on 10 mg pred one half daily sometimes joints ache and takes a whole and all better, can't tolerate nsaids due to gi upset, even advil.  no cough or sob  rec initiate plaquenil  January 07, 2009 ov taking plaquenil daily @ 200 mg. able to decrease prednisone down to 10 mg  daily with no flare of arthitis, cough, sob, occular or articular c/os  LFT's ok,  rec that she try one half on odd days and continue 200 mg daily of plaquenil  April 11, 2009 ov still on plaqenil and able to maintain prednisone 10 mg one half daily but occasionally misses dose and no change in symtpoms 3 mo follow up.   no ocular or articular symptoms or rash so try 10 mg one half daily  July 29, 2009 Followup.  Pt states that she is doing well and denies any complaints of arthritis, sob cough or increase rash.  Pt denies any significant sore throat, dysphagia, itching, sneezing,  nasal congestion or excess secretions,  fever, chills, sweats, unintended wt loss, pleuritic or exertional cp, hempoptysis, change in activity tolerance  orthopnea pnd or leg swelling   Current Medications (verified): 1)  Estradiol 1 Mg  Tabs (Estradiol) .... Once Daily 2)  Prednisone 10 Mg  Tabs (Prednisone) .... One Half Daily 3)  Benicar Hct 20-12.5 Mg  Tabs (Olmesartan Medoxomil-Hctz) .... Take 1 Tablet By Mouth Once A Day 4)  Womens One Daily   Tabs (Multiple  Vitamins-Minerals) .... Once Daily 5)  Caltrate 600+d 600-400 Mg-Unit  Tabs (Calcium Carbonate-Vitamin D) .... Two Times A Day 6)  Alendronate Sodium 70 Mg  Tabs (Alendronate Sodium) .... Once Weekly On "Sundays 7)  Plaquenil 200 Mg  Tabs (Hydroxychloroquine Sulfate) .... One Daily 8)  Simvastatin 20 Mg Tabs (Simvastatin) .... 1 Once Daily 9)  Aciphex 20 Mg Tbec (Rabeprazole Sodium) .... 1 Once Daily  Allergies (verified): 1)  ! Sulfa 2)  ! Ibuprofen  Past History:  Past Medical History: IHD  -left heart catheter 4/94 and 95% LAD reduced to 10% with PCA  -R./S. Myoview negative for ischemia ejection fraction 67% 06/25/04 HBP SARCOIDOSIS...................................................................................Bandy Honaker    - Prednisone dep since 1997 > every other day dosing started July 29, 2009     - Plaquenil rx 12/08 > stopped ? when ? why    - Plaquenil restarted Nov 04, 2008 >    - PFT's January 07, 2009 FVC 97% DLC0 66%  Vital Signs:  Patient profile:   55 year old female Weight:      211 pounds O2 Sat:      98 % on Room air Temp:     97" .9 degrees F oral Pulse rate:   66 / minute BP sitting:   120 / 70  (left arm)  Vitals Entered By: Vernie Murders (July 29, 2009 9:23 AM)  O2 Flow:  Room air  Physical Exam  Additional Exam:  wt 205 Nov 04, 2008 > 204 January 07, 2009 > 210 April 11, 2009 >  211 July 29, 2009  Ambulatory healthy obese black female in no acute distress. HEENT: nl dentition, turbinates, and orophanx. Nl external ear canals without cough reflex Neck without JVD/Nodes/TM Lungs clear to A and P bilaterally without cough on insp or exp maneuvers RRR no s3 or murmur or increase in P2 Abd soft and benign with nl excursion in the supine position. No bruits or organomegaly Ext warm without calf tenderness, cyanosis clubbing or edema Skin warm and dry with classic violaceous plaques over both lower extremity ext surfaces  R > L,  no change  Total  Bilirubin           0.4 mg/dL                   1.9-1.4   Direct Bilirubin          0.1 mg/dL                   7.8-2.9   Alkaline Phosphatase      46 U/L                      39-117   AST                       18 U/L                      0-37   ALT                       14 U/L                      0-35   Total Protein             7.1 g/dL                    5.6-2.1   Albumin                   4.0 g/dL                    3.0-8.6  Tests: (2) BMP (METABOL)   Sodium                    143 mEq/L                   135-145   Potassium                 3.7 mEq/L                   3.5-5.1   Chloride                  106 mEq/L                   96-112   Carbon Dioxide            30 mEq/L                    19-32   Glucose                   92 mg/dL  70-99   BUN                       8 mg/dL                     0-45   Creatinine                0.8 mg/dL                   4.0-9.8   Calcium                   9.1 mg/dL                   1.1-91.4   GFR                       95.84 mL/min                >60  Tests: (3) Sed Rate (ESR)   Sed Rate                  8 mm/hr                     0-22   Impression & Recommendations:  Problem # 1:  PULMONARY SARCOIDOSIS (ICD-135) The goal with a chronic steroid dependent illness is always arriving at the lowest effective dose that controls the disease/symptoms and not accepting a set "formula" which is based on statistics that don't take into accound individual variability or the natural hx of the dz in every individual patient, which may well vary over time.   Now  convinced she's responded to plaquenil so try taper prednisone closer to physiologic levels and see if she flares or develops any signs of adrenal insufficiency.  See instructions for specific recommendations   Medications Added to Medication List This Visit: 1)  Simvastatin 20 Mg Tabs (Simvastatin) .Marland Kitchen.. 1 once daily 2)  Aciphex 20 Mg Tbec (Rabeprazole sodium) .Marland Kitchen.. 1 once  daily  Other Orders: Est. Patient Level III ([address]) TLB-Hepatic/Liver Function Pnl (80076-HEPATIC) TLB-BMP (Basic Metabolic Panel-BMET) (80048-METABOL) TLB-Sedimentation Rate (ESR) (85652-ESR)  Patient Instructions: 1)  Try prednisone 10 mg one half odd days only,  looking  for any increase in cough short of breath or arthritis or nausea or weakness, if so resume previous dose 2)  After one month ok to try every 3rd day then return here 2 weeks later (around March 15th) with cxr.

## 2010-07-30 NOTE — Progress Notes (Signed)
Summary: nos appt  Phone Note Call from Patient   Caller: [email or web address]  Call For: Melis Trochez Summary of Call: Rsc nos from 1/17 to 2/17. Initial call taken by: Darletta Moll,  July 15, 2010 9:27 AM

## 2010-07-30 NOTE — Assessment & Plan Note (Signed)
Summary: Pulmonary/ try bid plaquenil   Primary Provider/Referring Provider:  Nyland  CC:  6 wk followup.  Pt states that her her breathing has been well.  She c/.o occ dry cough.  She also c/o increased pain in her joints..  History of Present Illness: 55 yobf  remote smoker with sarcoidosis chronically that has been prednisone dependent since 1997.Marland Kitchen  Historically  when  tapers of prednisone below 10 mg daily she has a flare up of either arthritis or cough/sob.   2/09 last ov stopped plaquenil  ? why? when?  Nov 04, 2008 ov cc sev months right foot pain over heads of MT's worse with weight bearing worse as day goes on...on 10 mg pred one half daily sometimes joints ache and takes a whole and all better, can't tolerate nsaids due to gi upset, even advil.  no cough or sob  rec initiate plaquenil  January 07, 2009 ov taking plaquenil daily @ 200 mg. able to decrease prednisone down to 10 mg  daily with no flare of arthitis, cough, sob, occular or articular c/os  LFT's ok,  rec that she try one half on odd days and continue 200 mg daily of plaquenil  April 11, 2009 ov still on plaqenil and able to maintain prednisone 10 mg one half daily but occasionally misses dose and no change in symtpoms 3 mo follow up.   no ocular or articular symptoms or rash so try 10 mg one half daily  July 29, 2009 Followup.  Pt states that she is doing well and denies any complaints of arthritis, sob cough or increase rash. try every other day 10mg  one half.  September 10, 2009 6 wk followup after sev weeks on 5 mg qod   Pt states that her her breathing has been fine.  She c/.o occ dry cough and c/o increased pain in her joints and rash on right ankle.  Pt denies any significant sore throat, dysphagia, itching, sneezing,  nasal congestion or excess secretions,  fever, chills, sweats, unintended wt loss, pleuritic or exertional cp, hempoptysis, change in activity tolerance  orthopnea pnd or leg swelling   Current  Medications (verified): 1)  Estradiol 1 Mg  Tabs (Estradiol) .... Once Daily 2)  Prednisone 10 Mg  Tabs (Prednisone) .... One Half Daily On Odd Days 3)  Benicar Hct 20-12.5 Mg  Tabs (Olmesartan Medoxomil-Hctz) .... Take 1 Tablet By Mouth Once A Day 4)  Womens One Daily   Tabs (Multiple Vitamins-Minerals) .... Once Daily 5)  Caltrate 600+d 600-400 Mg-Unit  Tabs (Calcium Carbonate-Vitamin D) .... Two Times A Day 6)  Alendronate Sodium 70 Mg  Tabs (Alendronate Sodium) .... Once Weekly On Sundays 7)  Plaquenil 200 Mg  Tabs (Hydroxychloroquine Sulfate) .... One Daily 8)  Simvastatin 20 Mg Tabs (Simvastatin) .... 1 Once Daily 9)  Aciphex 20 Mg Tbec (Rabeprazole Sodium) .... 1 Once Daily 10)  Medroxyprogesterone Acetate 5 Mg Tabs (Medroxyprogesterone Acetate) .... 1 Once Daily 11)  Amlodipine Besylate 2.5 Mg Tabs (Amlodipine Besylate) .... 1 Once Daily  Allergies (verified): 1)  ! Sulfa 2)  ! Ibuprofen  Vital Signs:  Patient profile:   55 year old female Weight:      213 pounds O2 Sat:      98 % on Room air Temp:     97 .6 degrees F oral Pulse rate:   62 / minute BP sitting:   116 / 82  (left arm)  Vitals Entered By: Vernie Murders (September 10, 2009  9:35 AM)  9:35 AM)  O2 Flow:  Room air  Physical Exam  Additional Exam:  wt 205 Nov 04, 2008 > 204 January 07, 2009 > 210 April 11, 2009 >  211 July 29, 2009 >   213 September 10, 2009  Ambulatory healthy obese black female in no acute distress. HEENT: nl dentition, turbinates, and orophanx. Nl external ear canals without cough reflex Neck without JVD/Nodes/TM Lungs clear to A and P bilaterally without cough on insp or exp maneuvers RRR no s3 or murmur or increase in P2 Abd soft and benign with nl excursion in the supine position. No bruits or organomegaly Ext warm without calf tenderness, cyanosis clubbing or edema Skin warm and dry with classic violaceous plaques over both lower extremity ext surfaces  R > L esp right ankle    Impression &  Recommendations:  Problem # 1:  PULMONARY SARCOIDOSIS (ICD-135) Mild flare wtih taper prednisone so try max the dose of plaquenil then return here for f/u lft's  The goal with a chronic steroid dependent illness is always arriving at the lowest effective dose that controls the disease/symptoms and not accepting a set "formula" which is based on statistics that don't take into accound individual variability or the natural hx of the dz in every individual patient, which may well vary over time.  for now will leave the floor where it is if possible @ 5 mg every other day unless doing great, in which case ok to taper to every 3rd day  Medications Added to Medication List This Visit: 1)  Prednisone 10 Mg Tabs (Prednisone) .... One half daily on odd days 2)  Plaquenil 200 Mg Tabs (Hydroxychloroquine sulfate) .... One twice daily 3)  Medroxyprogesterone Acetate 5 Mg Tabs (Medroxyprogesterone acetate) .Marland Kitchen.. 1 once daily 4)  Amlodipine Besylate 2.5 Mg Tabs (Amlodipine besylate) .Marland Kitchen.. 1 once daily 5)  Diprolene 0.05 % Oint (Betamethasone dipropionate aug) .... Apply daily, not for use on face  Patient Instructions: 1)  Increase plaquenil to 200 mg twice daily  2)  Try diprolone ointment for rash apply daily, not for use on face 3)  Please schedule a follow-up appointment in 4 weeks, sooner if needed  4)  Continue Prednisone 10 mg one half every other day if feel great ok to try every 3rd day Prescriptions: DIPROLENE 0.05 % OINT (BETAMETHASONE DIPROPIONATE AUG) apply daily, not for use on face  #15 gm tube x 0   Entered and Authorized by:   Nyoka Cowden MD   Signed by:   Nyoka Cowden MD on 09/10/2009   Method used:   [facility]        [facility] [phone]* (retail)       [address]       [place], [place]  [address]       Ph: [phone] or [phone]       Fax: [phone]   RxID:   [id number] PLAQUENIL 200 MG  TABS (HYDROXYCHLOROQUINE SULFATE) one  twice daily  #60 x 11   Entered and Authorized by:   Nyoka Cowden MD   Signed by:   Nyoka Cowden MD on 09/10/2009   Method used:   Electronically to        [facility] [phone]* (retail)       [address]       [place], [place]  [address]       Ph: [phone] or [phone]       Fax: [phone]   RxID:   [id number]   Appended Document: Orders Update    Clinical Lists Changes  Orders: Added new Service order of Est. Patient Level III (27[address]) - Signed

## 2010-07-30 NOTE — Assessment & Plan Note (Signed)
Summary: Pulmonary/ f/u sarcoid, try taper prednisone to 10 mg per day   Primary Provider/Referring Provider:  Nyland  CC:  Joint pain- improved.  History of Present Illness: 55  yobf  remote smoker with sarcoidosis chronically that has been prednisone dependent since 1997.Marland Kitchen  Historically  when  tapers of prednisone below 10 mg daily she has a flare up of either arthritis or cough/sob.     Nov 04, 2008 ov cc sev months right foot pain over heads of MT's worse with weight bearing worse as day goes on...on 10 mg pred one half daily sometimes joints ache and takes a whole and all better, can't tolerate nsaids due to gi upset, even advil.  no cough or sob  rec initiate plaquenil  January 07, 2009 ov taking plaquenil daily @ 200 mg. able to decrease prednisone down to 10 mg  daily with no flare of arthitis, cough, sob, occular or articular c/os  LFT's ok,  rec that she try one half on odd days and continue 200 mg daily of plaquenil  April 11, 2009 ov still on plaqenil and able to maintain prednisone 10 mg one half daily but occasionally misses dose and no change in symtpoms 3 mo follow up.   no ocular or articular symptoms or rash so try 10 mg one half daily  July 29, 2009 Followup.  Pt states that she is doing well and denies any complaints of arthritis, sob cough or increase rash. try every other day 10mg  one half.  September 10, 2009 6 wk followup after sev weeks on 5 mg qod   Pt states that her her breathing has been fine.  She c/.o occ dry cough and c/o increased pain in her joints and rash on right ankle.  rec double plaquenil 200mg   twice daily and reduce pred to 5 mg q 3 d  > rash resolved, cough gone, breath, joints achy but no worse  October 13, 2009  4 wk followup.  Pt states that her joint pain is the same- no better.    Nov 12, 2009 ov no change joints on or off prednisone, no cough or sob.   see page 2 February 20, 2010  cc  increased joint pain- esp in hands and lower back since stopped  prednisone.  Breathing is fine and states no cough.    cannot take nsaids, no better with tylenol >   rec Prednisone 10 mg 2 daily until better, then one daily until next visit  Hold simvastatin (cholesterol medication) for the next 6 weeks Stopped plaquenil after convinced it was causing atingling and this resolved.  April 08, 2010 ov cc joint pain resolved 100% on 20  and returned 50% on 10 mg daily off plaquenil and also now has " cramps" in back of thighs better  if stretch out or walk it out,  only one week seems worse at bedtime.  no cough or sob.  ok to stay off plaquenil for now  and simvostatin per primary. Prednisone is 10mg  2 daily until better then one and a half Tonic water at betime for cramps  May 27, 2010 ov maintained on 15 mg per day no flare of cough sob or arthritis, no longer needed tonic water for cramps. Pt denies any significant sore throat, dysphagia, itching, sneezing,  nasal congestion or excess secretions,  fever, chills, sweats, unintended wt loss, pleuritic or exertional cp, hempoptysis, change in activity tolerance  orthopnea pnd or leg swelling no ocular  c/o's    Current Medications (verified): 1)  Losartan Potassium 100 Mg Tabs (Losartan Potassium) .Marland Kitchen.. 1 Once Daily 2)  Womens One Daily   Tabs (Multiple Vitamins-Minerals) .... Once Daily 3)  Caltrate 600+d 600-400 Mg-Unit  Tabs (Calcium Carbonate-Vitamin D) .... Two Times A Day 4)  Alendronate Sodium 70 Mg  Tabs (Alendronate Sodium) .... Once Weekly On Sundays 5)  Prilosec Otc 20 Mg Tbec (Omeprazole Magnesium) .... Take  One 30-60 Min Before First Meal of The Day As Needed 6)  Prednisone 10 Mg  Tabs (Prednisone) .... 2 Daily Until Better Then  1 and Half Daily 7)  Diprolene 0.05 % Oint (Betamethasone Dipropionate Aug) .... Apply Daily, Not For Use On Face As Needed 8)  Bayer Childrens Aspirin 81 Mg Chew (Aspirin) .... One Daily 9)  Tylenol 325 Mg Tabs (Acetaminophen) .... Per Bottle Directions As  Needed  Allergies (verified): 1)  ! Sulfa 2)  ! Ibuprofen  Vital Signs:  Patient profile:   55 year old female Weight:      204 pounds O2 Sat:      97 % on Room air Temp:     98 .2 degrees F oral Pulse rate:   68 / minute BP sitting:   114 / 70  (left arm)  Vitals Entered By: Vernie Murders (May 27, 2010 9:00 AM)  O2 Flow:  Room air  Physical Exam  Additional Exam:  wt 205 Nov 04, 2008   > 56 October 13, 2009 > 206 Nov 12, 2009 > 203 February 20, 2010 > 201 April 08, 2010 > 204 May 27, 2010  Ambulatory healthy obese black female in no acute distress. HEENT: nl dentition, turbinates, and orophanx. Nl external ear canals without cough reflex Neck without JVD/Nodes/TM Lungs clear to A and P bilaterally without cough on insp or exp maneuvers RRR no s3 or murmur or increase in P2 Abd soft and benign with nl excursion in the supine position. No bruits or organomegaly Ext warm without calf tenderness, cyanosis clubbing or edema Skin warm and dry with no residual rash MS  nl gait, no joint restrictions or swelling noted   Impression & Recommendations:  Problem # 1:  PULMONARY SARCOIDOSIS (ICD-135) Apparently intolerant of plaquenil   The goal with a chronic steroid dependent illness is always arriving at the lowest effective dose that controls the disease/symptoms and not accepting a set "formula" which is based on statistics that don't take into accound individual variability or the natural hx of the dz in every individual patient, which may well vary over time.  It appears "0" is not an adequate  baseline for control of the arthritis component although it did eliminate the rash and she's had no pulmonary flare and not convinced the aches and pains are even related to sarcoid (see Prob #2)  Try new ceiling of  15 mg per day and floor of 10 mg per day  Each maintenance medication was reviewed in detail including most importantly the difference between maintenance and as  needed and under what circumstances the prns are to be used.   Medications Added to Medication List This Visit: 1)  Losartan Potassium 100 Mg Tabs (Losartan potassium) .Marland Kitchen.. 1 once daily 2)  Prilosec Otc 20 Mg Tbec (Omeprazole magnesium) .... Take  one 30-60 min before first meal of the day as needed 3)  Prednisone 10 Mg Tabs (Prednisone) .Marland Kitchen.. 1 and half on even, one on odd x 2 weeks,  then 1 days 4)  Diprolene 0.05 % Oint (Betamethasone dipropionate aug) .... Apply daily, not for use on face as needed  Other Orders: Est. Patient Level III (60454)  Patient Instructions: 1)  Try prednisone 1 and half on even, one on odd x 2 weeks,  then 1 day 2)  Please schedule a follow-up appointment in 6 weeks, sooner if needed

## 2010-07-30 NOTE — Progress Notes (Signed)
Summary: prescript  Phone Note From Pharmacy Call back at [phone]   Caller: Patient Caller: [facility] [phone]* Call For: wert  Summary of Call: have questions about diprolene.05% ointment Initial call taken by: Rickard Patience,  September 10, 2009 11:59 AM  Follow-up for Phone Call        diprolene only come in 50gm tube so gave pharmacy the ok for this with no refills Follow-up by: Philipp Deputy CMA,  September 10, 2009 12:29 PM

## 2010-07-30 NOTE — Assessment & Plan Note (Signed)
Summary: Pulmonary/ f/u ov d/c prednisone   Primary Provider/Referring Provider:  Nyland  CC:  4 wk followup.  Pt states that her joint pain is the same- no better.  She states that is has been exercising more since last seen and wonders if this is why pain no better.  She states that her rash has resolved.  No new complaints today.Marland Kitchen  History of Present Illness: 42 yobf  remote smoker with sarcoidosis chronically that has been prednisone dependent since 1997.Marland Kitchen  Historically  when  tapers of prednisone below 10 mg daily she has a flare up of either arthritis or cough/sob.   2/09 last ov stopped plaquenil  ? why? when?  Nov 04, 2008 ov cc sev months right foot pain over heads of MT's worse with weight bearing worse as day goes on...on 10 mg pred one half daily sometimes joints ache and takes a whole and all better, can't tolerate nsaids due to gi upset, even advil.  no cough or sob  rec initiate plaquenil  January 07, 2009 ov taking plaquenil daily @ 200 mg. able to decrease prednisone down to 10 mg  daily with no flare of arthitis, cough, sob, occular or articular c/os  LFT's ok,  rec that she try one half on odd days and continue 200 mg daily of plaquenil  April 11, 2009 ov still on plaqenil and able to maintain prednisone 10 mg one half daily but occasionally misses dose and no change in symtpoms 3 mo follow up.   no ocular or articular symptoms or rash so try 10 mg one half daily  July 29, 2009 Followup.  Pt states that she is doing well and denies any complaints of arthritis, sob cough or increase rash. try every other day 10mg  one half.  September 10, 2009 6 wk followup after sev weeks on 5 mg qod   Pt states that her her breathing has been fine.  She c/.o occ dry cough and c/o increased pain in her joints and rash on right ankle.  rec double plaquenil 200mg   twice daily and reduce pred to 5 mg q 3 d  > rash resolved, cough gone, breath, joints achy but no worse  October 13, 2009  4 wk  followup.  Pt states that her joint pain is the same- no better.  She states that is has been exercising more since last seen and wonders if this is why pain no better.  She states that her rash has resolved. no cough, no sob, no ocular cos.  Current Medications (verified): 1)  Estradiol 1 Mg  Tabs (Estradiol) .... Once Daily 2)  Prednisone 10 Mg  Tabs (Prednisone) .... One Half Daily On Odd Days 3)  Benicar Hct 20-12.5 Mg  Tabs (Olmesartan Medoxomil-Hctz) .... Take 1 Tablet By Mouth Once A Day 4)  Womens One Daily   Tabs (Multiple Vitamins-Minerals) .... Once Daily 5)  Caltrate 600+d 600-400 Mg-Unit  Tabs (Calcium Carbonate-Vitamin D) .... Two Times A Day 6)  Alendronate Sodium 70 Mg  Tabs (Alendronate Sodium) .... Once Weekly On Sundays 7)  Plaquenil 200 Mg  Tabs (Hydroxychloroquine Sulfate) .... One Twice Daily 8)  Simvastatin 20 Mg Tabs (Simvastatin) .Marland Kitchen.. 1 Once Daily 9)  Aciphex 20 Mg Tbec (Rabeprazole Sodium) .Marland Kitchen.. 1 Once Daily 10)  Medroxyprogesterone Acetate 5 Mg Tabs (Medroxyprogesterone Acetate) .Marland Kitchen.. 1 Once Daily 11)  Amlodipine Besylate 2.5 Mg Tabs (Amlodipine Besylate) .Marland Kitchen.. 1 Once Daily 12)  Diprolene 0.05 % Oint (Betamethasone Dipropionate Aug) .Marland KitchenMarland KitchenMarland Kitchen  Apply Daily, Not For Use On Face  Allergies (verified): 1)  ! Sulfa 2)  ! Ibuprofen  Past History:  Past Medical History: IHD  -left heart catheter 4/94 and 95% LAD reduced to 10% with PCA  -R./S. Myoview negative for ischemia ejection fraction 67% 06/25/04 HBP SARCOIDOSIS..................................................................................Marland KitchenWert    - Prednisone dep since 1997 > every other day dosing started July 29, 2009     - Plaquenil rx 12/08 > stopped ? when ? why    - Plaquenil restarted Nov 04, 2008 >  increase to 200 two times a day 09/10/09    - PFT's January 07, 2009 FVC 97% DLC0 66%    - Try off prednisone October 13, 2009   Vital Signs:  Patient profile:   55 year old female Weight:      212  pounds O2 Sat:      98 % on Room air Temp:     98.1 degrees F oral Pulse rate:   70 / minute BP sitting:   106 / 60  (left arm)  Vitals Entered By: Vernie Murders (October 13, 2009 9:14 AM)  O2 Flow:  Room air  Physical Exam  Additional Exam:  wt 205 Nov 04, 2008 > 204 January 07, 2009 >  211 July 29, 2009 >   213 September 10, 2009 > 212 October 13, 2009  Ambulatory healthy obese black female in no acute distress. HEENT: nl dentition, turbinates, and orophanx. Nl external ear canals without cough reflex Neck without JVD/Nodes/TM Lungs clear to A and P bilaterally without cough on insp or exp maneuvers RRR no s3 or murmur or increase in P2 Abd soft and benign with nl excursion in the supine position. No bruits or organomegaly Ext warm without calf tenderness, cyanosis clubbing or edema Skin warm and dry with minimal (< dime sized)  violaceous plaques   Impression & Recommendations:  Problem # 1:  PULMONARY SARCOIDOSIS (ICD-135) The goal with a chronic steroid dependent illness is always arriving at the lowest effective dose that controls the disease/symptoms and not accepting a set "formula" which is based on statistics that don't take into accound individual variability or the natural hx of the dz in every individual patient, which may well vary over time.  For now will try to wean off prednisone on high dose plaquenil  Will need ocular exam due to sarcoid and plaquaenil  Medications Added to Medication List This Visit: 1)  Bayer Childrens Aspirin 81 Mg Chew (Aspirin) .... One daily  Patient Instructions: 1)  Please schedule a follow-up appointment in 4weeks, sooner if needed  with cxr return 2)  stop prednisone 3)  Make sure there is opth f/u scheduled in next 3 months due to sarcoid and plaquenil use  Appended Document: Orders Update    Clinical Lists Changes  Orders: Added new Service order of Est. Patient Level III (82956) - Signed      Appended Document: Pulmonary/  f/u ov d/c prednisone [id number]  Appended Document: Pulmonary/ f/u ov d/c prednisone Spoke with pt and advised she needs appt with opth due to sarcoid and plaquenil use.  Pt states that she is already aware that she is due for appt and she is going to call this wk to get this sched.

## 2010-08-14 ENCOUNTER — Encounter: Payer: Self-pay | Admitting: Internal Medicine

## 2010-08-14 ENCOUNTER — Ambulatory Visit (INDEPENDENT_AMBULATORY_CARE_PROVIDER_SITE_OTHER): Payer: PRIVATE HEALTH INSURANCE | Admitting: Internal Medicine

## 2010-08-14 DIAGNOSIS — D869 Sarcoidosis, unspecified: Secondary | ICD-10-CM

## 2010-08-14 DIAGNOSIS — M2550 Pain in unspecified joint: Secondary | ICD-10-CM

## 2010-08-19 NOTE — Assessment & Plan Note (Signed)
Summary: Pulmonary/ sarcoid f/u try 10/5    Primary Provider/Referring Provider:  Nyland  CC:  3 mth rov - f/u sarcoid - Denies cough or SOB - c/o Joint stiffness - Prednisone at 10mg   once a day.  History of Present Illness: 55  yobf  remote smoker with sarcoidosis chronically that has been prednisone dependent since 1997.Marland Kitchen  Historically  when  tapers of prednisone below 10 mg daily she has a flare up of either arthritis or cough/sob.     Nov 04, 2008 ov cc sev months right foot pain over heads of MT's worse with weight bearing worse as day goes on...on 10 mg pred one half daily sometimes joints ache and takes a whole and all better, can't tolerate nsaids due to gi upset, even advil.  no cough or sob  rec initiate plaquenil  January 07, 2009 ov taking plaquenil daily @ 200 mg. able to decrease prednisone down to 10 mg  daily with no flare of arthitis, cough, sob, occular or articular c/os  LFT's ok,  rec that she try one half on odd days and continue 200 mg daily of plaquenil  April 11, 2009 ov still on plaqenil and able to maintain prednisone 10 mg one half daily but occasionally misses dose and no change in symtpoms 3 mo follow up.   no ocular or articular symptoms or rash so try 10 mg one half daily  July 29, 2009 Followup.  Pt states that she is doing well and denies any complaints of arthritis, sob cough or increase rash. try every other day 10mg  one half.  September 10, 2009 6 wk followup after sev weeks on 5 mg qod   Pt states that her her breathing has been fine.  She c/.o occ dry cough and c/o increased pain in her joints and rash on right ankle.  rec double plaquenil 200mg   twice daily and reduce pred to 5 mg q 3 d  > rash resolved, cough gone, breath, joints achy but no worse  October 13, 2009  4 wk followup.  Pt states that her joint pain is the same- no better.    Nov 12, 2009 ov no change joints on or off prednisone, no cough or sob.   see page 2 February 20, 2010  cc  increased  joint pain- esp in hands and lower back since stopped prednisone.  Breathing is fine and states no cough.    cannot take nsaids, no better with tylenol >   rec Prednisone 10 mg 2 daily until better, then one daily until next visit  Hold simvastatin (cholesterol medication) for the next 6 weeks Stopped plaquenil after convinced it was causing atingling and this resolved.  April 08, 2010 ov cc joint pain resolved 100% on 20  and returned 50% on 10 mg daily off plaquenil and also now has " cramps" in back of thighs better  if stretch out or walk it out,  only one week seems worse at bedtime.  no cough or sob.  ok to stay off plaquenil for now  and simvostatin per primary. Prednisone is 10mg  2 daily until better then one and a half Tonic water at betime for cramps  May 27, 2010 ov maintained on 15 mg per day no flare of cough sob or arthritis, no longer needed tonic water for cramps rec Try prednisone 1 and half on even, one on odd x 2 weeks,  then 1 day  August 14, 2010 on 10 mg  per day a little stiffness but no cough or sob. Pt denies any significant sore throat, dysphagia, itching, sneezing,  nasal congestion or excess secretions,  fever, chills, sweats, unintended wt loss, pleuritic or exertional cp, hempoptysis, change in activity tolerance  orthopnea pnd or leg swelling. no ocular symptoms  Current Medications (verified): 1)  Losartan Potassium 100 Mg Tabs (Losartan Potassium) .Marland Kitchen.. 1 Once Daily 2)  Womens One Daily   Tabs (Multiple Vitamins-Minerals) .... Once Daily 3)  Caltrate 600+d 600-400 Mg-Unit  Tabs (Calcium Carbonate-Vitamin D) .... Two Times A Day 4)  Alendronate Sodium 70 Mg  Tabs (Alendronate Sodium) .... Once Weekly On "Sundays 5)  Prilosec Otc 20 Mg Tbec (Omeprazole Magnesium) .... Take  One 30-60 Min Before First Meal of The Day As Needed 6)  Prednisone 10 Mg  Tabs (Prednisone) .... 1 and Half On Even, One On Odd X 2 Weeks,  Then 1 Days 7)  Diprolene 0.05 % Oint  (Betamethasone Dipropionate Aug) .... Apply Daily, Not For Use On Face As Needed 8)  Bayer Childrens Aspirin 81 Mg Chew (Aspirin) .... One Daily 9)  Tylenol 325 Mg Tabs (Acetaminophen) .... Per Bottle Directions As Needed 10)  Estroven  Tabs (Nutritional Supplements) .... Take One By Mouth Once Daily For Hot Flashes  Allergies (verified): 1)  ! Sulfa 2)  ! Ibuprofen  Comments:  Nurse/Medical Assistant: The patient's medications and allergies were reviewed with the patient and were updated in the Medication and Allergy Lists.  Past History:  Past Medical History: IHD  -left heart catheter 4/94 and 95% LAD reduced to 10% with PCA  -R./S. Myoview negative for ischemia ejection fraction 67% 06/25/04 HBP SARCOIDOSIS.......................................................................................Briana Berry    - Prednisone dep since 1997     - Plaquenil rx 12/08 > stopped ? when ? why    - Plaquenil restarted Nov 04, 2008 >  increase to 200 two times a day 09/10/09 > d/c  02/23/10 100% resolved    - PFT's January 07, 2009 FVC 97% DLC0 66%    - Try off prednisone October 13, 2009 > worse aches February 20, 2010 so restarted   Vital Signs:  Patient profile:   55 year old female Height:      66 inches Weight:      205.38 pounds BMI:     33.27 O2 Sat:      100 % on Room air Temp:     98" .1 degrees F oral Pulse rate:   60 / minute BP sitting:   126 / 90  (right arm) Cuff size:   regular  Vitals Entered By: Abigail Miyamoto RN (August 14, 2010 9:23 AM)  O2 Flow:  Room air  Physical Exam  Additional Exam:  wt 205 Nov 04, 2008  >  201 April 08, 2010 > 204 May 27, 2010 > 205 August 15, 2010  Ambulatory healthy obese black female in no acute distress. HEENT: nl dentition, turbinates, and orophanx. Nl external ear canals without cough reflex Neck without JVD/Nodes/TM Lungs clear to A and P bilaterally without cough on insp or exp maneuvers RRR no s3 or murmur or increase in P2 Abd  soft and benign with nl excursion in the supine position. No bruits or organomegaly Ext warm without calf tenderness, cyanosis clubbing or edema Skin warm and dry with no residual rash MS  nl gait, no joint restrictions or swelling noted   Impression & Recommendations:  Problem # 1:  PULMONARY SARCOIDOSIS (ICD-135)  Apparently intolerant of plaquenil  The goal with a chronic steroid dependent illness is always arriving at the lowest effective dose that controls the disease/symptoms and not accepting a set "formula" which is based on statistics that don't take into accound individual variability or the natural hx of the dz in every individual patient, which may well vary over time.  It appears "0" is not an adequate  baseline for control of the arthritis component although it did eliminate the rash and she's had no pulmonary flare and not convinced the aches and pains are even related to sarcoid (see Prob #2)  Try new ceiling of  10 mg per day and floor of 10/5 on alternating days  Each maintenance medication was reviewed in detail including most importantly the difference between maintenance and as needed and under what circumstances the prns are to be used.   Problem # 2:  ARTHRALGIA (ICD-719.40)  Prob sarcoid though many symtoms atypical so maintaining  off statins for now to establish baseline then ? rechallenge at some point ok with me  Orders: Est. Patient Level III (25366)  Medications Added to Medication List This Visit: 1)  Estroven Tabs (Nutritional supplements) .... Take one by mouth once daily for hot flashes  Patient Instructions: 1)  Try 10 mg on even days and 5mg  ( or one half pill) on odd days. 2)  Return to office in 3 months, sooner if needed

## 2010-11-13 NOTE — Procedures (Signed)
NAME:  Briana Berry, Briana Berry NO.:  [id number]   MEDICAL RECORD NO.:  [id number]         PATIENT TYPE:  REC   LOCATION:  RAD                           FACILITY:  [facility]   PHYSICIAN:  Vida Roller, M.D.   DATE OF BIRTH:  Nov 16, 1955   DATE OF PROCEDURE:  DATE OF DISCHARGE:                                    STRESS TEST   HISTORY:  Briana Berry is a 55 year old female with coronary artery disease  who had a cardiac catheterization back in 1994, revealing a 95% LAD  stenosis.  She had a directional coronary arthrectomy reducing the stenosis  to 10%.  She now presents with one episode of chest discomfort back in the  beginning of December.  This was associated with dizziness and numbness in  her hands.  It occurred at rest.  She denies any further episodes of chest  discomfort since that time.   BASELINE DATA:  EKG reveals a sinus rhythm at 58 beats per minute with some  small T-waves noted in the inferior leads and some nonspecific ST  abnormalities.  Blood pressure is 138/78.   The patient exercised for a total of 8 minutes to Bruce protocol stage 3,  and 10.1 mets.  Maximum heart rate was 152 beats per minute which is 89% of  predicted maximum.  Maximum blood pressure was 198/82, which resolved down  to 140/88.  Myoview was injected at one minute prior to cessation of  exercise.  EKG revealed no arrhythmias.  She did have some T-wave inversions  in the inferior lateral leads in recovery that resolved after approximately  10 minutes in recovery.  She denied any chest discomfort.  She did have some  mild shortness of breath at the end of exercise which resolved in recovery.   Final images and results are pending M.D. review.     Amy   AB/MEDQ  D:  06/25/2004  T:  06/25/2004  Job:  [id number]

## 2010-12-17 ENCOUNTER — Encounter: Payer: Self-pay | Admitting: Internal Medicine

## 2010-12-18 ENCOUNTER — Encounter: Payer: Self-pay | Admitting: Internal Medicine

## 2010-12-18 ENCOUNTER — Ambulatory Visit (INDEPENDENT_AMBULATORY_CARE_PROVIDER_SITE_OTHER): Payer: PRIVATE HEALTH INSURANCE | Admitting: Internal Medicine

## 2010-12-18 VITALS — BP 112/64 | HR 55 | Temp 97.8°F | Ht 66.0 in | Wt 209.8 lb

## 2010-12-18 DIAGNOSIS — D869 Sarcoidosis, unspecified: Secondary | ICD-10-CM

## 2010-12-18 MED ORDER — PREDNISONE 10 MG PO TABS: ORAL_TABLET | ORAL | Status: DC

## 2010-12-18 NOTE — Patient Instructions (Signed)
Increase the prednisone to 10 mg daily until aches gone and then ok to reduce the dose every dose every 3rd day to one half  Keep appt to see your eye doctor  Please schedule a follow up visit in 3  months but call sooner if needed  - in [place] is ok if we can schedule it

## 2010-12-18 NOTE — Assessment & Plan Note (Signed)
The goal with a chronic steroid dependent illness is always arriving at the lowest effective dose that controls the disease/symptoms and not accepting a set "formula" which is based on statistics or guidelines that don't always take into account patient  variability or the natural hx of the dz in every individual patient, which may well vary over time.  For now therefore I recommend the patient maintain  10 mg per day since can't tolerate the 10/5 dosing due to classic sarcoid arthralagias  Cannot tol plaquenil.  mtx another option but would need much closer f/u.  Discussed in detail all the  indications, usual  risks and alternatives  relative to the benefits with patient who agrees to proceed with daily pred @ 10mg 

## 2010-12-18 NOTE — Progress Notes (Signed)
Subjective:     Patient ID: Briana Berry, female   DOB: Nov 20, 1955, 55 y.o.   MRN: [id number]  HPI  30  yobf remote smoker with sarcoidosis chronically that has been prednisone dependent since 1997.Marland Kitchen Historically when tapers of prednisone below 10 mg daily she has a flare up of either arthritis or cough/sob.   Nov 04, 2008 ov cc sev months right foot pain over heads of MT's worse with weight bearing worse as day goes on...on 10 mg pred one half daily sometimes joints ache and takes a whole and all better, can't tolerate nsaids due to gi upset, even advil. no cough or sob rec initiate plaquenil  > could not tolerate tingling recurred on rechallenge so remained on prednisone indefinitely   August 14, 2010 on 10 mg per day a little stiffness but no cough or sob. rec try 10 mg a/w 5 mg  12/18/2010 ov/Eiko Mcgowen on pred 10/5 with increase aches but no cough, rash, sob.  Worse aches / stiffness in elbows, ankles. No ocular cos  Pt denies any significant sore throat, dysphagia, itching, sneezing,  nasal congestion or excess/ purulent secretions,  fever, chills, sweats, unintended wt loss, pleuritic or exertional cp, hempoptysis, orthopnea pnd or leg swelling.    Also denies any obvious fluctuation of symptoms with weather or environmental changes or other aggravating or alleviating factors.        Allergies :  1) ! Sulfa  2) ! Ibuprofen  :  Past Medical History:  IHD  -left heart catheter 4/94 and 95% LAD reduced to 10% with PCA  -R./S. Myoview negative for ischemia ejection fraction 67% 06/25/04  HBP  SARCOIDOSIS......................................................................................Marland KitchenWert  - Prednisone dep since 1997  - Try off prednisone October 13, 2009 > worse aches February 20, 2010 so restarted - Plaquenil rx 12/08 > stopped ? when ? why  - Plaquenil restarted Nov 04, 2008 > increase to 200 two times a day 09/10/09 > d/c 02/23/10  Due to tingling  100% resolved off it - PFT's January 07, 2009 FVC 97% DLC0 66%      Review of Systems     Objective:   Physical Exam wt 205 Nov 04, 2008 > 201 April 08, 2010 > 204 May 27, 2010 > 205 August 15, 2010 > 204 12/18/2010  Ambulatory healthy obese black female in no acute distress.  HEENT: nl dentition, turbinates, and orophanx. Nl external ear canals without cough reflex  Neck without JVD/Nodes/TM  Lungs clear to A and P bilaterally without cough on insp or exp maneuvers  RRR no s3 or murmur or increase in P2  Abd soft and benign with nl excursion in the supine position. No bruits or organomegaly  Ext warm without calf tenderness, cyanosis clubbing or edema  Skin warm and dry with no residual rash  MS nl gait, no joint restrictions or swelling noted    Assessment:         Plan:

## 2011-05-04 ENCOUNTER — Encounter: Payer: Self-pay | Admitting: Internal Medicine

## 2011-05-04 ENCOUNTER — Ambulatory Visit (INDEPENDENT_AMBULATORY_CARE_PROVIDER_SITE_OTHER): Payer: PRIVATE HEALTH INSURANCE | Admitting: Internal Medicine

## 2011-05-04 DIAGNOSIS — D869 Sarcoidosis, unspecified: Secondary | ICD-10-CM

## 2011-05-04 DIAGNOSIS — I10 Essential (primary) hypertension: Secondary | ICD-10-CM

## 2011-05-04 NOTE — Assessment & Plan Note (Signed)
Not sure she took her cozaar this am > advised to take each am and monitor salt intake carefully. Also note wt trending up on chronic prednisone so need to work toward calorie balance also

## 2011-05-04 NOTE — Patient Instructions (Signed)
Prednisone 10 mg daily if arthritis flares, otherwise continue 5 mg daily  Please schedule a follow up visit in 3 months but call sooner if needed

## 2011-05-04 NOTE — Assessment & Plan Note (Signed)
-   Prednisone dep since 1997  - Try off prednisone October 13, 2009 > worse aches February 20, 2010 so restarted - Plaquenil rx 12/08 > stopped ? when ? why  - Plaquenil restarted Nov 04, 2008 > increase to 200 two times a day 09/10/09 > d/c 02/23/10  Due to tingling  100% resolved off it - PFT's January 07, 2009 FVC 97% DLC0 66%   The goal with a chronic steroid dependent illness is always arriving at the lowest effective dose that controls the disease/symptoms and not accepting a set "formula" which is based on statistics or guidelines that don't always take into account patient  variability or the natural hx of the dz in every individual patient, which may well vary over time.  For now therefore I recommend the patient maintain  A ceiling of 10 mg daily and a floor of 5 mg.  See instructions for specific recommendations which were reviewed directly with the patient who was given a copy with highlighter outlining the key components.

## 2011-05-04 NOTE — Progress Notes (Signed)
Subjective:     Patient ID: Briana Berry, female   DOB: 1955/11/04, 55 y.o.   MRN: [id number]  HPI  55  yobf remote smoker with sarcoidosis chronically that has been prednisone dependent since 1997.Marland Kitchen Historically when tapers of prednisone below 10 mg daily she has a flare up of either arthritis or cough/sob.   Nov 04, 2008 ov cc sev months right foot pain over heads of MT's worse with weight bearing worse as day goes on...on 10 mg pred one half daily sometimes joints ache and takes a whole and all better, can't tolerate nsaids due to gi upset, even advil. no cough or sob rec initiate plaquenil  > could not tolerate tingling recurred on rechallenge so remained on prednisone indefinitely   August 14, 2010 on 10 mg per day a little stiffness but no cough or sob. rec try 10 mg a/w 5 mg  12/18/2010 ov/Hazim Treadway on pred 10/5 with increase aches but no cough, rash, sob.  Worse aches / stiffness in elbows, ankles. No ocular cos rec Increase the prednisone to 10 mg daily until aches gone and then ok to reduce the dose every dose every 3rd day to one half Keep appt to see your eye doctor    05/04/2011 f/u ov/Stormy Sabol cc joint pains resolved on 10 mg per day then reduced to 5 mg daily and only rarely flares and needs 10 mg but  no ocular complaints,  Rash, cough or sob on either dose.   Pt denies any significant sore throat, dysphagia, itching, sneezing,  nasal congestion or excess/ purulent secretions,  fever, chills, sweats, unintended wt loss, pleuritic or exertional cp, hempoptysis, orthopnea pnd or leg swelling.    Also denies any obvious fluctuation of symptoms with weather or environmental changes or other aggravating or alleviating factors.     Allergies :  1) ! Sulfa  2) ! Ibuprofen  :  Past Medical History:  IHD  -left heart catheter 4/94 and 95% LAD reduced to 10% with PCA  -R./S. Myoview negative for ischemia ejection fraction 67% 06/25/04  HBP    SARCOIDOSIS......................................................................................Marland KitchenWert  - Prednisone dep since 1997  - Try off prednisone October 13, 2009 > worse aches February 20, 2010 so restarted - Plaquenil rx 12/08 > stopped ? when ? why  - Plaquenil restarted Nov 04, 2008 > increase to 200 two times a day 09/10/09 > d/c 02/23/10  Due to tingling  100% resolved off it - PFT's January 07, 2009 FVC 97% DLC0 66%      Review of Systems     Objective:   Physical Exam wt 205 Nov 04, 2008 > 201 April 08, 2010 > 204 May 27, 2010 > 205 August 15, 2010> 05/04/2011  208 Ambulatory healthy obese black female in no acute distress slt cushingnoid HEENT: nl dentition, turbinates, and orophanx. Nl external ear canals without cough reflex  Neck without JVD/Nodes/TM  Lungs clear to A and P bilaterally without cough on insp or exp maneuvers  RRR no s3 or murmur or increase in P2  Abd soft and benign with nl excursion in the supine position. No bruits or organomegaly  Ext warm without calf tenderness, cyanosis clubbing or edema  Skin warm and dry with no residual rash  MS nl gait, no joint restrictions or swelling noted    Assessment:         Plan:

## 2011-08-03 ENCOUNTER — Telehealth: Payer: Self-pay | Admitting: *Deleted

## 2011-08-03 NOTE — Telephone Encounter (Signed)
LMTCBx1 to schedule pt for her f/u appt in March in [place]. Carron Curie, CMA

## 2011-08-12 NOTE — Telephone Encounter (Signed)
LMTCBx2. Donovan Persley, CMA  

## 2011-08-16 NOTE — Telephone Encounter (Signed)
LMTCBx3. Moncia Annas, CMA  

## 2011-08-18 ENCOUNTER — Encounter: Payer: Self-pay | Admitting: *Deleted

## 2011-08-18 NOTE — Telephone Encounter (Signed)
Per protocol I will sign off and send a letter. Carron Curie, CMA

## 2011-12-14 ENCOUNTER — Encounter: Payer: PRIVATE HEALTH INSURANCE | Admitting: Internal Medicine

## 2011-12-14 NOTE — Progress Notes (Signed)
 This encounter was created in error - please disregard.

## 2011-12-20 ENCOUNTER — Encounter: Payer: Self-pay | Admitting: Adult Health

## 2011-12-20 ENCOUNTER — Ambulatory Visit (INDEPENDENT_AMBULATORY_CARE_PROVIDER_SITE_OTHER)
Admission: RE | Admit: 2011-12-20 | Discharge: 2011-12-20 | Disposition: A | Discharge status: Home / Self Care | Payer: PRIVATE HEALTH INSURANCE | Source: Ambulatory Visit | Attending: Adult Health | Admitting: Adult Health

## 2011-12-20 ENCOUNTER — Ambulatory Visit (INDEPENDENT_AMBULATORY_CARE_PROVIDER_SITE_OTHER): Payer: PRIVATE HEALTH INSURANCE | Admitting: Adult Health

## 2011-12-20 VITALS — BP 130/90 | HR 66 | Temp 97.7°F | Ht 66.0 in | Wt 195.4 lb

## 2011-12-20 DIAGNOSIS — D869 Sarcoidosis, unspecified: Secondary | ICD-10-CM

## 2011-12-20 DIAGNOSIS — M2550 Pain in unspecified joint: Secondary | ICD-10-CM

## 2011-12-20 NOTE — Patient Instructions (Addendum)
We are referring you to rheumatology .  I will call with xray results.  follow up Dr. Sherene Sires  In 6 weeks and As needed

## 2011-12-20 NOTE — Progress Notes (Signed)
Subjective:     Patient ID: Briana Berry, female   DOB: 1956/05/17, 56 y.o.   MRN: [id number]  HPI  61 yobf remote smoker with sarcoidosis chronically that has been prednisone dependent since 1997.Marland Kitchen Historically when tapers of prednisone below 10 mg daily she has a flare up of either arthritis or cough/sob.   Nov 04, 2008 ov cc sev months right foot pain over heads of MT's worse with weight bearing worse as day goes on...on 10 mg pred one half daily sometimes joints ache and takes a whole and all better, can't tolerate nsaids due to gi upset, even advil. no cough or sob rec initiate plaquenil  > could not tolerate tingling recurred on rechallenge so remained on prednisone indefinitely   August 14, 2010 on 10 mg per day a little stiffness but no cough or sob. rec try 10 mg a/w 5 mg  12/18/2010 ov/Wert on pred 10/5 with increase aches but no cough, rash, sob.  Worse aches / stiffness in elbows, ankles. No ocular cos rec Increase the prednisone to 10 mg daily until aches gone and then ok to reduce the dose every dose every 3rd day to one half Keep appt to see your eye doctor    05/04/2011 f/u ov/Wert cc joint pains resolved on 10 mg per day then reduced to 5 mg daily and only rarely flares and needs 10 mg but  no ocular complaints,  Rash, cough or sob on either dose. rec Prednisone 10 mg daily if arthritis flares, otherwise continue 5 mg daily   12/14/2011 f/u ov/Wert cc No show    12/20/2011 Acute OV  Pt presents to discuss prednisone . She has been on chronic steroids on /off for last 15 years. Was able to get off for 4 months in 2011 but went back on due to joint aches.  Was weaned down 5mg  05/2011, she further tapered herself off 2-3 months ago.  No flare in cough or dyspnea. No rash. No visual issues.  Since off steroids does have joint aches on/off. Esp in knees.  Wants to see if has other choices beside steroids  We discussed rheumatology referral .             Allergies :    1) ! Sulfa  2) ! Ibuprofen  :  Past Medical History:  IHD  -left heart catheter 4/94 and 95% LAD reduced to 10% with PCA  -R./S. Myoview negative for ischemia ejection fraction 67% 06/25/04  HBP  SARCOIDOSIS......................................................................................Marland KitchenWert  - Prednisone dep since 1997  - Try off prednisone October 13, 2009 > worse aches February 20, 2010 so restarted - Plaquenil rx 12/08 > stopped ? when ? why  - Plaquenil restarted Nov 04, 2008 > increase to 200 two times a day 09/10/09 > d/c 02/23/10  Due to tingling  100% resolved off it - PFT's January 07, 2009 FVC 97% DLC0 66%      Review of Systems Constitutional:   No  weight loss, night sweats,  Fevers, chills, ++ fatigue, or  lassitude.  HEENT:   No headaches,  Difficulty swallowing,  Tooth/dental problems, or  Sore throat,                No sneezing, itching, ear ache, nasal congestion, post nasal drip,   CV:  No chest pain,  Orthopnea, PND, swelling in lower extremities, anasarca, dizziness, palpitations, syncope.   GI  No heartburn, indigestion, abdominal pain, nausea, vomiting, diarrhea, change in bowel habits, loss  of appetite, bloody stools.   Resp:  No coughing up of blood.   No chest wall deformity  Skin: no rash or lesions.  GU: no dysuria, change in color of urine, no urgency or frequency.  No flank pain, no hematuria   MS: ++joint pain  .  No decreased range of motion   Psych:  No change in mood or affect. No depression or anxiety.  No memory loss.         Objective:   Physical Exam wt 205 Nov 04, 2008 > 201 April 08, 2010 > 204 May 27, 2010 > 205 August 15, 2010> 05/04/2011  208 > 12/14/2011 >195 12/20/2011  Ambulatory healthy obese black female in no acute distress slt cushingnoid HEENT: nl dentition, turbinates, and orophanx. Nl external ear canals without cough reflex  Neck without JVD/Nodes/TM  Lungs clear to A and P bilaterally without cough on insp or  exp maneuvers  RRR no s3 or murmur or increase in P2  Abd soft and benign with nl excursion in the supine position. No bruits or organomegaly  Ext warm without calf tenderness, cyanosis clubbing or edema  Skin warm and dry with no residual rash  MS nl gait, no joint restrictions or swelling noted    Assessment:         Plan:

## 2011-12-22 ENCOUNTER — Telehealth: Payer: Self-pay | Admitting: Internal Medicine

## 2011-12-22 NOTE — Telephone Encounter (Signed)
Spoke with pt and notified of cxr results per TP. Pt verbalized understanding and denied any questions. Nothing further needed.

## 2011-12-22 NOTE — Progress Notes (Signed)
Quick Note:  Spoke with pt and notified of results. Pt verbalized understanding. ______ 

## 2011-12-23 NOTE — Assessment & Plan Note (Addendum)
Clinically stable w/ no flare in cough , wheezing or dyspnea  Check cxr today .  However more arthralgias off steroids , will refer to rheumatology for evaluation for consideration of  Steroid sparing rx and evaluation of arthralgias .  follow up Dr. Sherene Sires  In 6 weeks and As needed

## 2012-02-01 ENCOUNTER — Ambulatory Visit (INDEPENDENT_AMBULATORY_CARE_PROVIDER_SITE_OTHER): Payer: PRIVATE HEALTH INSURANCE | Admitting: Internal Medicine

## 2012-02-01 ENCOUNTER — Encounter: Payer: Self-pay | Admitting: Internal Medicine

## 2012-02-01 VITALS — BP 118/76 | HR 86 | Temp 98.2°F | Ht 66.0 in | Wt 199.0 lb

## 2012-02-01 DIAGNOSIS — D869 Sarcoidosis, unspecified: Secondary | ICD-10-CM

## 2012-02-01 NOTE — Patient Instructions (Addendum)
Please schedule a follow up visit in [place] office  in 3 months but call sooner if needed with cxr and pfts

## 2012-02-01 NOTE — Progress Notes (Signed)
Subjective:     Patient ID: Briana Berry, female   DOB: December 10, 1955 .   MRN: [id number]  HPI  35 yobf remote smoker with sarcoidosis chronically that had been prednisone dependent since 1997.Marland Kitchen Historically when tapered of prednisone below 10 mg daily > flare up of either arthritis or cough/sob.  Finally tapered off April 2013  Nov 04, 2008 ov cc sev months right foot pain over heads of MT's worse with weight bearing worse as day goes on...on 10 mg pred one half daily sometimes joints ache and takes a whole and all better, can't tolerate nsaids due to gi upset, even advil. no cough or sob rec initiate plaquenil  > could not tolerate tingling recurred on rechallenge so remained on prednisone indefinitely    12/18/2010 ov/Lorance Pickeral on pred 10/5 with increase aches but no cough, rash, sob.  Worse aches / stiffness in elbows, ankles. No ocular cos rec Increase the prednisone to 10 mg daily until aches gone and then ok to reduce the dose every dose every 3rd day to one half Keep appt to see your eye doctor    05/04/2011 f/u ov/Glenis Musolf cc joint pains resolved on 10 mg per day then reduced to 5 mg daily and only rarely flares and needs 10 mg but  no ocular complaints,  Rash, cough or sob on either dose. rec Prednisone 10 mg daily if arthritis flares, otherwise continue 5 mg daily  12/14/2011 f/u ov/Moishe Schellenberg  Missed appt    12/20/2011 Acute OV / NP/ discuss prednisone . She has been on chronic steroids on /off for last 15 years. Was able to get off for 4 months in 2011 but went back on due to joint aches.  Was weaned down 5mg  05/2011, she further tapered herself off 2-3 months ago.  No flare in cough or dyspnea. No rash. No visual issues.  Since off steroids does have joint aches on/off. Esp in knees.  rec Referred  to rheumatology > Dr Dareen Piano d/c prednisone and start mtx effective July 26/2013   02/01/2012 f/u ov/Kinston Magnan cc no problem with breathing, some mild rash esp over chins and bad arthritis that seems  some better since starting mtx injections per Dr Dareen Piano.    No unusual cough, purulent sputum or sinus/hb symptoms on present rx.    Sleeping ok without nocturnal  or early am exacerbation  of respiratory  c/o's or need for noct saba. Also denies any obvious fluctuation of symptoms with weather or environmental changes or other aggravating or alleviating factors except as outlined above   ROS  The following are not active complaints unless bolded sore throat, dysphagia, dental problems, itching, sneezing,  nasal congestion or excess/ purulent secretions, ear ache,   fever, chills, sweats, unintended wt loss, pleuritic or exertional cp, hemoptysis,  orthopnea pnd or leg swelling, presyncope, palpitations, heartburn, abdominal pain, anorexia, nausea, vomiting, diarrhea  or change in bowel or urinary habits, change in stools or urine, dysuria,hematuria,  rash, arthralgias, visual complaints, headache, numbness weakness or ataxia or problems with walking or coordination,  change in mood/affect or memory.                Allergies :  1) ! Sulfa  2) ! Ibuprofen    Past Medical History:  IHD  -left heart catheter 4/94 and 95% LAD reduced to 10% with PCA  -R./S. Myoview negative for ischemia ejection fraction 67% 06/25/04  HBP  SARCOIDOSIS......................................................................................Marland KitchenWert / Dareen Piano (Rheum) - Prednisone dep 1997 to April 2013  -  Try off prednisone October 13, 2009 > worse aches February 20, 2010 so restarted - Plaquenil rx 12/08 > stopped ? when ? why  - Plaquenil restarted Nov 04, 2008 > increase to 200 two times a day 09/10/09 > d/c 02/23/10  Due to tingling  100% resolved off it - PFT's January 07, 2009 FVC 97% DLC0 66%            Objective:   Physical Exam wt 205 Nov 04, 2008 > 201 April 08, 2010 > 204 May 27, 2010 > 205 August 15, 2010> 05/04/2011  208 > 12/14/2011 >195 12/20/2011 > 02/01/2012  199 Ambulatory healthy obese  black female in no acute distress slt cushingnoid HEENT: nl dentition, turbinates, and orophanx. Nl external ear canals without cough reflex  Neck without JVD/Nodes/TM  Lungs clear to A and P bilaterally without cough on insp or exp maneuvers  RRR no s3 or murmur or increase in P2  Abd soft and benign with nl excursion in the supine position. 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

## 2012-02-01 NOTE — Assessment & Plan Note (Signed)
-   Prednisone dep since 1997  - Try off prednisone October 13, 2009 > worse aches February 20, 2010 so restarted > stopped April 2013 - Plaquenil rx 12/08 > stopped ? when ? why  - Plaquenil restarted Nov 04, 2008 > increase to 200 two times a day 09/10/09 > d/c 02/23/10  Due to tingling  100% resolved off it - PFT's January 07, 2009 FVC 97% DLC0 66%  - MTX started 01/21/12   Agree with trying to control arthritis with mtx, defer all rx to Dr Dareen Piano  Will need a restaging with cxr/ pft's in 3 months and yearly as risk of mtx toxicity and difficult to tease this out from sarcoid symptoms

## 2012-05-24 ENCOUNTER — Ambulatory Visit (INDEPENDENT_AMBULATORY_CARE_PROVIDER_SITE_OTHER)
Admission: RE | Admit: 2012-05-24 | Discharge: 2012-05-24 | Disposition: A | Discharge status: Home / Self Care | Payer: PRIVATE HEALTH INSURANCE | Source: Ambulatory Visit | Attending: Internal Medicine | Admitting: Internal Medicine

## 2012-05-24 ENCOUNTER — Ambulatory Visit (INDEPENDENT_AMBULATORY_CARE_PROVIDER_SITE_OTHER): Payer: PRIVATE HEALTH INSURANCE | Admitting: Internal Medicine

## 2012-05-24 ENCOUNTER — Encounter: Payer: Self-pay | Admitting: Internal Medicine

## 2012-05-24 VITALS — BP 120/90 | HR 71 | Temp 97.8°F | Ht 65.0 in | Wt 193.0 lb

## 2012-05-24 DIAGNOSIS — D869 Sarcoidosis, unspecified: Secondary | ICD-10-CM

## 2012-05-24 DIAGNOSIS — D860 Sarcoidosis of lung: Secondary | ICD-10-CM

## 2012-05-24 DIAGNOSIS — J99 Respiratory disorders in diseases classified elsewhere: Secondary | ICD-10-CM

## 2012-05-24 LAB — PULMONARY FUNCTION TEST

## 2012-05-24 NOTE — Progress Notes (Signed)
Subjective:     Patient ID: Briana Berry, female   DOB: 04-08-1956 .   MRN: [id number]  HPI  73 yobf remote smoker with sarcoidosis chronically that had been prednisone dependent since 1997.Marland Kitchen Historically when tapered of prednisone below 10 mg daily > flare up of either arthritis or cough/sob.  Finally tapered off April 2013  Nov 04, 2008 ov cc sev months right foot pain over heads of MT's worse with weight bearing worse as day goes on...on 10 mg pred one half daily sometimes joints ache and takes a whole and all better, can't tolerate nsaids due to gi upset, even advil. no cough or sob rec initiate plaquenil  > could not tolerate tingling recurred on rechallenge so remained on prednisone indefinitely    12/18/2010 ov/Cabella Kimm on pred 10/5 with increase aches but no cough, rash, sob.  Worse aches / stiffness in elbows, ankles. No ocular cos rec Increase the prednisone to 10 mg daily until aches gone and then ok to reduce the dose every dose every 3rd day to one half Keep appt to see your eye doctor    05/04/2011 f/u ov/Ebelyn Bohnet cc joint pains resolved on 10 mg per day then reduced to 5 mg daily and only rarely flares and needs 10 mg but  no ocular complaints,  Rash, cough or sob on either dose. rec Prednisone 10 mg daily if arthritis flares, otherwise continue 5 mg daily  12/14/2011 f/u ov/Acea Yagi  Missed appt    12/20/2011 Acute OV / NP/ discuss prednisone . She has been on chronic steroids on /off for last 15 years. Was able to get off for 4 months in 2011 but went back on due to joint aches.  Was weaned down 5mg  05/2011, she further tapered herself off 2-3 months ago.  No flare in cough or dyspnea. No rash. No visual issues.  Since off steroids does have joint aches on/off. Esp in knees.  rec Referred  to rheumatology > Dr Dareen Piano d/c prednisone and start mtx effective July 26/2013   02/01/2012 f/u ov/Luana Tatro cc no problem with breathing, some mild rash esp over chins and bad arthritis that seems  some better since starting mtx injections per Dr Dareen Piano.  rec no change rx >  cxr and pfts @ 3 mo    05/24/2012 f/u ov/Der Gagliano cc some aches and pains but overall much better on mtx, no cough / sob/ rash off prednisone since 12/2011. No ocular complaints.  No unusual cough, purulent sputum or sinus/hb symptoms on present rx.    Sleeping ok without nocturnal  or early am exacerbation  of respiratory  c/o's or need for noct saba. Also denies any obvious fluctuation of symptoms with weather or environmental changes or other aggravating or alleviating factors except as outlined above   ROS  The following are not active complaints unless bolded sore throat, dysphagia, dental problems, itching, sneezing,  nasal congestion or excess/ purulent secretions, ear ache,   fever, chills, sweats, unintended wt loss, pleuritic or exertional cp, hemoptysis,  orthopnea pnd or leg swelling, presyncope, palpitations, heartburn, abdominal pain, anorexia, nausea, vomiting, diarrhea  or change in bowel or urinary habits, change in stools or urine, dysuria,hematuria,  rash, arthralgias, visual complaints, headache, numbness weakness or ataxia or problems with walking or coordination,  change in mood/affect or memory.                Allergies :  1) ! Sulfa  2) ! Ibuprofen    Past Medical History:  IHD  -left heart catheter 4/94 and 95% LAD reduced to 10% with PCA  -R./S. Myoview negative for ischemia ejection fraction 67% 06/25/04  HBP  SARCOIDOSIS......................................................................................Marland KitchenWert / Dareen Piano (Rheum) - Prednisone dep 1997 to April 2013  - Try off prednisone October 13, 2009 > worse aches February 20, 2010 so restarted>  stopped April 2013 - Plaquenil rx 12/08 > stopped ? when ? why  - Plaquenil restarted Nov 04, 2008 > increase to 200 two times a day 09/10/09 > d/c 02/23/10  Due to tingling  100% resolved off it             Objective:    Physical Exam wt 205 Nov 04, 2008 > 201 April 08, 2010 > 204 May 27, 2010 > 205 August 15, 2010> 05/04/2011  208 > 12/14/2011 >195 12/20/2011 > 02/01/2012 199> 05/24/2012  193 Ambulatory healthy obese black female in no acute distress   HEENT: nl dentition, turbinates, and orophanx. Nl external ear canals without cough reflex  Neck without JVD/Nodes/TM  Lungs clear to A and P bilaterally without cough on insp or exp maneuvers  RRR no s3 or murmur or increase in P2  Abd soft and benign with nl excursion in the supine position. No bruits or organomegaly  Ext warm without calf tenderness, cyanosis clubbing or edema  Skin warm and dry with no residual rash  MS nl gait, no joint restrictions or swelling noted     CXR  05/24/2012 :   History of sarcoidosis. The chronic bilateral upper lobe changes  appear stable. Hilar and mediastinal contours appear stable. No  acute superimposed abnormality is evident.   Assessment:         Plan:

## 2012-05-24 NOTE — Progress Notes (Signed)
PFT done today. 

## 2012-05-24 NOTE — Assessment & Plan Note (Signed)
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 when ? 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 Absent this problem, f/u can be yearly with pft's and cxr    Each maintenance medication was reviewed in detail including most importantly the difference between maintenance and as needed and under what circumstances the prns are to be used.  Please see instructions for details which were reviewed in writing and the patient given a copy.

## 2012-05-24 NOTE — Patient Instructions (Addendum)
Return here yearly for pulmonary function tests and cxr unless new respiratory problems (if needed come to [facility] Tuesday of any month or here anytime)

## 2012-06-02 ENCOUNTER — Encounter: Payer: Self-pay | Admitting: Internal Medicine

## 2013-08-16 ENCOUNTER — Other Ambulatory Visit: Payer: Self-pay | Admitting: Internal Medicine

## 2013-08-16 DIAGNOSIS — D869 Sarcoidosis, unspecified: Secondary | ICD-10-CM

## 2013-08-17 ENCOUNTER — Ambulatory Visit (INDEPENDENT_AMBULATORY_CARE_PROVIDER_SITE_OTHER)
Admission: RE | Admit: 2013-08-17 | Discharge: 2013-08-17 | Disposition: A | Discharge status: Home / Self Care | Payer: BC Managed Care – PPO | Source: Ambulatory Visit | Attending: Internal Medicine | Admitting: Internal Medicine

## 2013-08-17 ENCOUNTER — Ambulatory Visit (INDEPENDENT_AMBULATORY_CARE_PROVIDER_SITE_OTHER): Payer: BC Managed Care – PPO | Admitting: Internal Medicine

## 2013-08-17 ENCOUNTER — Encounter: Payer: Self-pay | Admitting: Internal Medicine

## 2013-08-17 VITALS — BP 112/80 | HR 71 | Temp 99.1°F | Ht 66.0 in | Wt 193.0 lb

## 2013-08-17 DIAGNOSIS — D869 Sarcoidosis, unspecified: Secondary | ICD-10-CM

## 2013-08-17 DIAGNOSIS — R059 Cough, unspecified: Secondary | ICD-10-CM

## 2013-08-17 DIAGNOSIS — R05 Cough: Secondary | ICD-10-CM

## 2013-08-17 LAB — PULMONARY FUNCTION TEST
FEF 25-75 Pre: 1.42 L/sec
FEF2575-%PRED-PRE: 62 %
FEV1-%Pred-Pre: 86 %
FEV1-PRE: 2 L
FEV1FVC-%Pred-Pre: 87 %
FEV6-%PRED-PRE: 100 %
FEV6-PRE: 2.84 L
FEV6FVC-%PRED-PRE: 102 %
FVC-%PRED-PRE: 97 %
FVC-PRE: 2.85 L
PRE FEV6/FVC RATIO: 100 %
Pre FEV1/FVC ratio: 70 %

## 2013-08-17 MED ORDER — PREDNISONE 10 MG PO TABS
ORAL_TABLET | ORAL | Status: DC
Start: 2013-08-17 — End: 2014-09-19

## 2013-08-17 MED ORDER — HYDROCODONE-ACETAMINOPHEN 5-300 MG PO TABS: ORAL_TABLET | ORAL | Status: DC

## 2013-08-17 MED ORDER — AMOXICILLIN-POT CLAVULANATE 875-125 MG PO TABS: 1.0000 | ORAL_TABLET | Freq: Two times a day (BID) | ORAL | Status: DC

## 2013-08-17 NOTE — Progress Notes (Signed)
Subjective:     Patient ID: Briana Berry, female   DOB: 29-Sep-1955 .   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  Finally tapered off April 2013    History of Present Illness    12/20/2011 Acute OV / NP/ discuss prednisone . She has been on chronic steroids on /off for last 15 years. Was able to get off for 4 months in 2011 but went back on due to joint aches.  Was weaned down 5mg  05/2011, she further tapered herself off 2-3 months ago.  No flare in cough or dyspnea. No rash. No visual issues.  Since off steroids does have joint aches on/off. Esp in knees.  rec Referred  to rheumatology > Dr Dareen Piano d/c prednisone and started mtx effective July 26/2013    08/17/2013 f/u ov/Wert re: sarcoid on mtx per Dr Dareen Piano Chief Complaint  Patient presents with  . Followup with PFT    Pt c/o cough x 5 days. Cough is non prod and has caused her throat to feel irritated and have sore chest.     Was fine prior to onset of apparent uri / no longer on ppi/ no fever, aches, rash, sob.  No obvious day to day or daytime variabilty or assoc  cp or chest tightness, subjective wheeze overt sinus or hb symptoms. No unusual exp hx or h/o childhood pna/ asthma or knowledge of premature birth.  Sleeping ok without nocturnal  or early am exacerbation  of respiratory  c/o's or need for noct saba. Also denies any obvious fluctuation of symptoms with weather or environmental changes or other aggravating or alleviating factors except as outlined above   Current Medications, Allergies, Complete Past Medical History, Past Surgical History, Family History, and Social History were reviewed in [employer] record.  ROS  The following are not active complaints unless bolded sore throat, dysphagia,  dental problems, itching, sneezing,  nasal congestion or excess/ purulent secretions, ear ache,   fever, chills, sweats, unintended wt loss, pleuritic or exertional cp, hemoptysis,  orthopnea pnd or leg swelling, presyncope, palpitations, heartburn, abdominal pain, anorexia, nausea, vomiting, diarrhea  or change in bowel or urinary habits, change in stools or urine, dysuria,hematuria,  rash, arthralgias, visual complaints, headache, numbness weakness or ataxia or problems with walking or coordination,  change in mood/affect or memory.       Allergies :  1) ! Sulfa  2) ! Ibuprofen     Past Medical History:  IHD  -left heart catheter 4/94 and 95% LAD reduced to 10% with PCA  -R./S. Myoview negative for ischemia ejection fraction 67% 06/25/04  HBP  SARCOIDOSIS......................................................................................Marland KitchenWert / Dareen Piano (Rheum) - Prednisone dep 1997 to April 2013  - Try off prednisone October 13, 2009 > worse aches February 20, 2010 so restarted>  stopped April 2013 - Plaquenil rx 12/08 > stopped ? when ? why  - Plaquenil restarted Nov 04, 2008 > increase to 200 two times a day 09/10/09 > d/c 02/23/10  Due to tingling  100% resolved off it             Objective:   Physical Exam wt 205 Nov 04, 2008 > 201 April 08, 2010 > 204 May 27, 2010 > 205 August 15, 2010> 05/04/2011  208 > 12/14/2011 >195 12/20/2011 > 02/01/2012  199> 05/24/2012  193 > 08/17/13  193  Ambulatory healthy obese black female in no acute distress   HEENT: nl dentition, turbinates, and orophanx. Nl external ear canals without cough reflex  Neck without JVD/Nodes/TM  Lungs clear to A and P bilaterally without cough on insp or exp maneuvers  RRR no s3 or murmur or increase in P2  Abd soft and benign with nl excursion in the supine position. No bruits or organomegaly  Ext warm without calf tenderness, cyanosis clubbing or edema  Skin warm and dry with no residual rash  MS nl gait, no  joint restrictions or swelling noted     CXR  08/17/2013 :   Upper lobe scarring, more on the right than on the left. No edema or consolidation. No adenopathy appreciable.   Assessment:

## 2013-08-17 NOTE — Patient Instructions (Signed)
For cough is mucinex dm 1200 every 12 hours and vicodin one every 4 hours if needed for bad cough or painful cough and always remember when coughing for any reason to hold the fosfamax and take prilosec Take 30- 60 min before your first and last meals of the day   Prednisone 10 mg take  4 each am x 2 days,   2 each am x 2 days,  1 each am x 2 days and stop Augmentin 875 mg take one pill twice daily  X 10 days - take at breakfast and supper with large glass of water.  It would help reduce the usual side effects (diarrhea and yeast infections) if you ate cultured yogurt at lunch.   Please remember to go to the  x-ray department downstairs for your tests - we will call you with the results when they are available.    Follow up can be yearly, sooner if needed

## 2013-08-17 NOTE — Progress Notes (Signed)
Spirometry done today. Pt was unable to complete full pft due to cough.

## 2013-08-18 DIAGNOSIS — R059 Cough, unspecified: Secondary | ICD-10-CM | POA: Insufficient documentation

## 2013-08-18 DIAGNOSIS — R05 Cough: Secondary | ICD-10-CM | POA: Insufficient documentation

## 2013-08-18 NOTE — Assessment & Plan Note (Addendum)
Explained natural history of uri and why it's necessary in patients at risk to treat GERD aggressively  at least  short term   to reduce risk of evolving cyclical cough initially  triggered by epithelial injury and a heightened sensitivty to the effects of any upper airway irritants,  most importantly acid - related.  That is, the more sensitive the epithelium damaged for virus, the more the cough, the more the secondary reflux (especially in those prone to reflux) the more the irritation of the sensitive mucosa and so on in a cyclical pattern.   Since also has nasal congestion and underlying rhinitis > rx pred x 6 d only, augmentin x10

## 2013-08-18 NOTE — Assessment & Plan Note (Signed)
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 when ? why  - Plaquenil restarted Nov 04, 2008 > increase to 200 two times a day 09/10/09 > d/c 02/23/10  Due to tingling  100% resolved off it - PFT's January 07, 2009 FVC 97% DLC0 66%  - PFT's 05/24/2012    FVC 2.86 88% DLCO 70 % and corrects to 121 - PFT's 08/17/2013  VC 2.85 (97%) unable to do dlco - MTX started 01/21/12   No evidence of active pulmonary sarcoid > most of her issues are arthritis related > f/u Dr Dareen PianoAnderson and here yearly - sooner if needed

## 2013-08-20 NOTE — Progress Notes (Signed)
Quick Note:  Spoke with pt and notified of results per Dr. Wert. Pt verbalized understanding and denied any questions.  ______ 

## 2014-08-30 ENCOUNTER — Ambulatory Visit: Payer: Self-pay | Admitting: Internal Medicine

## 2014-09-19 ENCOUNTER — Ambulatory Visit (INDEPENDENT_AMBULATORY_CARE_PROVIDER_SITE_OTHER)
Admission: RE | Admit: 2014-09-19 | Discharge: 2014-09-19 | Disposition: A | Discharge status: Home / Self Care | Payer: BLUE CROSS/BLUE SHIELD | Source: Ambulatory Visit | Attending: Internal Medicine | Admitting: Internal Medicine

## 2014-09-19 ENCOUNTER — Encounter: Payer: Self-pay | Admitting: Internal Medicine

## 2014-09-19 ENCOUNTER — Ambulatory Visit (INDEPENDENT_AMBULATORY_CARE_PROVIDER_SITE_OTHER): Payer: BLUE CROSS/BLUE SHIELD | Admitting: Internal Medicine

## 2014-09-19 DIAGNOSIS — D869 Sarcoidosis, unspecified: Secondary | ICD-10-CM

## 2014-09-19 MED ORDER — PREDNISONE 10 MG PO TABS: ORAL_TABLET | ORAL | Status: DC

## 2014-09-19 NOTE — Progress Notes (Signed)
Subjective:     Patient ID: Briana Berry, female   DOB: 18-Jun-1956 .   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  Finally tapered off April 2013    History of Present Illness  12/20/2011 Acute OV / NP/ discuss prednisone . She has been on chronic steroids on /off for last 15 years. Was able to get off for 4 months in 2011 but went back on due to joint aches.  Was weaned down  05/2011, she further tapered herself off 2-3 months ago.  No flare in cough or dyspnea. No rash. No visual issues.  Since off steroids does have joint aches on/off. Esp in knees.  rec Referred  to rheumatology > Dr Dareen Piano d/c prednisone and started mtx effective July 2013    08/17/2013 f/u ov/Tanecia Mccay re: sarcoid on mtx per Dr Dareen Piano Chief Complaint  Patient presents with  . Followup with PFT    Pt c/o cough x 5 days. Cough is non prod and has caused her throat to feel irritated and have sore chest.   Was fine prior to onset of apparent uri / no longer on ppi/ no fever, aches, rash, sob. rec For cough is mucinex dm 1200 every 12 hours and vicodin one every 4 hours if needed for bad cough or painful cough and always remember when coughing for any reason to hold the fosfamax and take prilosec Take 30- 60 min before your first and last meals of the day  Prednisone 10 mg take  4 each am x 2 days,   2 each am x 2 days,  1 each am x 2 days and stop Augmentin 875 mg take one pill twice daily  X 10 days    09/19/2014 f/u ov/Townes Fuhs re: sarcoidosis/ off pred since 2013 / on  mtx per Dr Dareen Piano  Chief Complaint  Patient presents with  . Follow-up    Pt c/o cough "since the weather change"- she states normally has trouble with allergies in the Spring. Her cough is non prod.  Her breathing is doing well.  She states has occ  "bumps" on her arms.   already started clariton helps nasal symptoms some/no purulent secretions or sinus pain  Skin changes both elbows wax and wane Not limited by breathing from desired activities     No obvious day to day or daytime variabilty or assoc  cp or chest tightness, subjective wheeze overt sinus or hb symptoms. No unusual exp hx or h/o childhood pna/ asthma or knowledge of premature birth.  Sleeping ok without nocturnal  or early am exacerbation  of respiratory  c/o's or need for noct saba. Also denies any obvious fluctuation of symptoms with weather or environmental changes or other aggravating or alleviating factors except as outlined above   Current Medications, Allergies, Complete Past Medical History, Past Surgical History, Family History, and Social History were reviewed in [employer] record.  ROS  The following are not active complaints unless bolded sore throat, dysphagia, dental problems, itching, sneezing,  nasal congestion or excess/ purulent secretions, ear ache,   fever, chills, sweats, unintended wt loss, pleuritic or exertional cp, hemoptysis,  orthopnea pnd or leg swelling, presyncope, palpitations, heartburn, abdominal pain, anorexia, nausea, vomiting, diarrhea  or change in bowel or urinary habits, change in stools  or urine, dysuria,hematuria,  rash, arthralgias worse than usual  , visual complaints, headache, numbness weakness or ataxia or problems with walking or coordination,  change in mood/affect or memory.            Past Medical History:  IHD  -left heart catheter 4/94 and 95% LAD reduced to 10% with PCA  -R./S. Myoview negative for ischemia ejection fraction 67% 06/25/04  HBP  SARCOIDOSIS dx by skin bx......................................................................................Marland Kitchen.Camar Guyton / Dareen PianoAnderson (Rheum) - Prednisone dep 1997 to April 2013  - Try off prednisone October 13, 2009 > worse aches February 20, 2010 so restarted>   stopped April 2013 - Plaquenil rx 12/08 > stopped ? when ? why  - Plaquenil restarted Nov 04, 2008 > increase to 200 two times a day 09/10/09 > d/c 02/23/10  Due to tingling  100% resolved off it             Objective:   Physical Exam wt 205 Nov 04, 2008 > 201 April 08, 2010 > 204 May 27, 2010 > 205 August 15, 2010> 05/04/2011  208 > 12/14/2011 >195 12/20/2011 > 02/01/2012 199> 05/24/2012  193 > 08/17/13  193> 09/19/2014  185   Ambulatory healthy  black female in no acute distress   HEENT: nl dentition, turbinates, and orophanx. Nl external ear canals without cough reflex  Neck without JVD/Nodes/TM  Lungs clear to A and P bilaterally without cough on insp or exp maneuvers  RRR no s3 or murmur or increase in P2  Abd soft and benign with nl excursion in the supine position. No bruits or organomegaly  Ext warm without calf tenderness, cyanosis clubbing or edema  Skin warm and dry with no significant rash  MS nl gait, no joint restrictions or swelling noted     CXR PA and Lateral:   09/19/2014 :     I personally reviewed images and agree with radiology impression as follows:    Biapical pleural parenchymal thickening again noted. These findings are consistent with scarring. No acute cardiopulmonary disease identified.  Assessment:

## 2014-09-19 NOTE — Patient Instructions (Signed)
Prednisone 10 mg take  4 each am x 2 days,   2 each am x 2 days,  1 each am x 2 days and stop  Be sure to tell Dr Dareen PianoAnderson whether the prednisone helped your arthritis or respiratory symptoms even if only temporarily   Please remember to go to the   x-ray department downstairs for your tests - we will call you with the results when they are available.  Pulmonary follow up can be yearly, sooner if needed

## 2014-09-21 ENCOUNTER — Encounter: Payer: Self-pay | Admitting: Internal Medicine

## 2014-09-21 NOTE — Assessment & Plan Note (Addendum)
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 when ? why  - Plaquenil restarted Nov 04, 2008 > increase to 200 two times a day 09/10/09 > d/c 02/23/10  Due to tingling  100% resolved off it - PFT's January 07, 2009 FVC 97% DLC0 66%  - PFT's 05/24/2012    FVC 2.86 88% DLCO 70 % and corrects to 121 - PFT's 08/17/2013  VC 2.85 (97%) unable to do dlco - MTX started 01/21/12    I had an extended discussion with the patient reviewing all relevant studies completed to date and  lasting 15 to 20 minutes of a 25 minute visit on the following ongoing concerns:  1) no evidence of sign sarcoid activity in lung or skin  2) focus is on rx of arthritis per Dr Dareen PianoAnderson  3) rx x 6 days pred today to see to what extent helps the nasal symptoms or arthritis and let Dr Dareen PianoAnderson know re the latter   4) Each maintenance medication was reviewed in detail including most importantly the difference between maintenance and as needed and under what circumstances the prns are to be used.  Please see instructions for details which were reviewed in writing and the patient given a copy.

## 2014-09-24 NOTE — Progress Notes (Signed)
Quick Note:  Spoke with pt and notified of results per Dr. Wert. Pt verbalized understanding and denied any questions.  ______ 

## 2014-09-24 NOTE — Progress Notes (Signed)
Quick Note:  lmtcb for pt. ______ 

## 2015-01-29 ENCOUNTER — Other Ambulatory Visit: Payer: Self-pay | Admitting: Urology

## 2015-04-01 ENCOUNTER — Other Ambulatory Visit: Payer: Self-pay

## 2015-04-01 ENCOUNTER — Encounter (HOSPITAL_COMMUNITY)
Admission: RE | Admit: 2015-04-01 | Discharge: 2015-04-01 | Disposition: A | Discharge status: Home / Self Care | Payer: BLUE CROSS/BLUE SHIELD | Source: Ambulatory Visit | Attending: Obstetrics and Gynecology | Admitting: Obstetrics and Gynecology

## 2015-04-01 ENCOUNTER — Encounter (HOSPITAL_COMMUNITY): Payer: Self-pay

## 2015-04-01 DIAGNOSIS — Z01818 Encounter for other preprocedural examination: Secondary | ICD-10-CM | POA: Insufficient documentation

## 2015-04-01 DIAGNOSIS — N8189 Other female genital prolapse: Secondary | ICD-10-CM | POA: Diagnosis not present

## 2015-04-01 HISTORY — DX: Gastro-esophageal reflux disease without esophagitis: K21.9

## 2015-04-01 HISTORY — DX: Unspecified osteoarthritis, unspecified site: M19.90

## 2015-04-01 HISTORY — DX: Type 2 diabetes mellitus without complications: E11.9

## 2015-04-01 LAB — TYPE AND SCREEN
ABO/RH(D): O POS
ANTIBODY SCREEN: NEGATIVE

## 2015-04-01 LAB — CBC
HEMATOCRIT: 40.1 % (ref 36.0–46.0)
Hemoglobin: 13.2 g/dL (ref 12.0–15.0)
MCH: 29.5 pg (ref 26.0–34.0)
MCHC: 32.9 g/dL (ref 30.0–36.0)
MCV: 89.7 fL (ref 78.0–100.0)
Platelets: 220 10*3/uL (ref 150–400)
RBC: 4.47 MIL/uL (ref 3.87–5.11)
RDW: 14.6 % (ref 11.5–15.5)
WBC: 6.9 10*3/uL (ref 4.0–10.5)

## 2015-04-01 LAB — PROTIME-INR
INR: 1.04 (ref 0.00–1.49)
Prothrombin Time: 13.8 seconds (ref 11.6–15.2)

## 2015-04-01 LAB — APTT: APTT: 27 s (ref 24–37)

## 2015-04-01 LAB — BASIC METABOLIC PANEL
Anion gap: 6 (ref 5–15)
BUN: 11 mg/dL (ref 6–20)
CALCIUM: 9.2 mg/dL (ref 8.9–10.3)
CO2: 28 mmol/L (ref 22–32)
CREATININE: 0.68 mg/dL (ref 0.44–1.00)
Chloride: 107 mmol/L (ref 101–111)
GFR calc non Af Amer: >60 mL/min (ref >60)
Glucose, Bld: 86 mg/dL (ref 65–99)
Potassium: 4.5 mmol/L (ref 3.5–5.1)
SODIUM: 141 mmol/L (ref 135–145)

## 2015-04-01 LAB — ABO/RH: ABO/RH(D): O POS

## 2015-04-01 NOTE — H&P (Signed)
  59 year old G 2 P 2 with symptomatic pelvic organ prolapse  Past Medical History  Diagnosis Date  . IHD (ischemic heart disease)   . Hypertension   . Sarcoidosis (HCC)   . Diabetes mellitus without complication (HCC)     borderline  . GERD (gastroesophageal reflux disease)   . Arthritis     Osteoarthritis   Past Surgical History  Procedure Laterality Date  . Cardiac catheterization      removed plaque  . Colonoscopy     Prior to Admission medications   Medication Sig Start Date End Date Taking? Authorizing Provider  HYDROcodone-acetaminophen (NORCO/VICODIN) 5-325 MG tablet Take 1 tablet by mouth every 6 (six) hours as needed for moderate pain.   Yes Historical Provider, MD  methotrexate 25 MG/ML injection Inject 0.6 mg into the vein once a week.    Yes Historical Provider, MD  acetaminophen (TYLENOL) 325 MG tablet Take 650 mg by mouth every 6 (six) hours as needed.      Historical Provider, MD  alendronate (FOSAMAX) 70 MG tablet Take 70 mg by mouth every 7 (seven) days. Take with a full glass of water on an empty stomach.     Historical Provider, MD  aspirin 81 MG tablet Take 81 mg by mouth daily.      Historical Provider, MD  Calcium Carbonate-Vitamin D (CALTRATE 600+D) 600-400 MG-UNIT per tablet Take 1 tablet by mouth 2 (two) times daily.      Historical Provider, MD  folic acid (FOLVITE) 1 MG tablet Take 2 mg by mouth daily.     Historical Provider, MD  losartan (COZAAR) 100 MG tablet Take 100 mg by mouth daily.     Historical Provider, MD  Multiple Vitamins-Minerals (WOMENS MULTI PO) Take 1 capsule by mouth daily.      Historical Provider, MD  omeprazole (PRILOSEC OTC) 20 MG tablet Take 20 mg by mouth daily before breakfast.      Historical Provider, MD  predniSONE (DELTASONE) 10 MG tablet Take  4 each am x 2 days,   2 each am x 2 days,  1 each am x 2 days and stop Patient not taking: Reported on 03/29/2015 09/19/14   Nyoka Cowden, MD   Allergies:  Codeine Ibuprofen and  Sulfa  Family History  Problem Relation Age of Onset  . Heart disease Father   . Cancer Mother     ? type   Social History   Social History  . Marital Status: Married    Spouse Name: N/A  . Number of Children: N/A  . Years of Education: N/A   Social History Main Topics  . Smoking status: Former Smoker -- 0.50 packs/day for 5 years    Types: Cigarettes    Quit date: 06/28/1992  . Smokeless tobacco: Never Used  . Alcohol Use: 0.0 oz/week     Comment: wine once per month  . Drug Use: No  . Sexual Activity: Not on file   Other Topics Concern  . Not on file   Social History Narrative   ROS: as above  There were no vitals taken for this visit. No results found for this or any previous visit (from the past 24 hour(s)). General alert and oriented Lung CTA Car  RRR Pelvic External genitalia within normal limits Vagina Grade 2 cystocele Grade 2 uterine prolapse No rectocele  IMPRESSION: Symptomatic Pelvic Organ Prolapse  PLAN: [facility] Anterior repair with sling with Dr Sherron Monday Risks reviewed Consent signed.

## 2015-04-01 NOTE — Anesthesia Preprocedure Evaluation (Addendum)
Anesthesia Evaluation  Patient identified by MRN, date of birth, ID band Patient awake    Reviewed: Allergy & Precautions, NPO status , Patient's Chart, lab work & pertinent test results, reviewed documented beta blocker date and time   Airway Mallampati: I       Dental no notable dental hx. (+) Teeth Intact   Pulmonary former smoker,  Hx/o Pulmonary sarcoidosis   Pulmonary exam normal breath sounds clear to auscultation       Cardiovascular Exercise Tolerance: Good hypertension, Pt. on medications + CAD  Normal cardiovascular exam Rhythm:Regular Rate:Normal  Walked up and down [facility] stairs twice with no SOB. CAD history is remote and is noted for atherectomy of 95% LAD lesion in 1994   Neuro/Psych negative neurological ROS  negative psych ROS   GI/Hepatic GERD  Medicated and Controlled,  Endo/Other  diabetes, Well Controlled, Type 2, Oral Hypoglycemic Agents  Renal/GU   negative genitourinary   Musculoskeletal  (+) Arthritis , Osteoarthritis,  Osteoporsis   Abdominal (+) + obese,   Peds  Hematology   Anesthesia Other Findings   Reproductive/Obstetrics                         Anesthesia Physical Anesthesia Plan  ASA: III  Anesthesia Plan: General   Post-op Pain Management:    Induction: Intravenous  Airway Management Planned: Oral ETT  Additional Equipment:   Intra-op Plan:   Post-operative Plan: Extubation in OR  Informed Consent: I have reviewed the patients History and Physical, chart, labs and discussed the procedure including the risks, benefits and alternatives for the proposed anesthesia with the patient or authorized representative who has indicated his/her understanding and acceptance.   Dental advisory given  Plan Discussed with: CRNA, Anesthesiologist and Surgeon  Anesthesia Plan Comments:      Anesthesia Quick Evaluation

## 2015-04-01 NOTE — Patient Instructions (Addendum)
Your procedure is scheduled on: Tuesday, April 08, 2015  Enter through the Main Entrance of [facility] at:  6:00 A.M.  Pick up the phone at the desk and dial [phone].  Call this number if you have problems the morning of surgery: [phone].  Remember: Do NOT eat food or drink liquids after:  Midnight Monday Take these medicines the morning of surgery with a SIP OF WATER: LOSARTAN  Do NOT wear jewelry (body piercing), metal hair clips/bobby pins, make-up, or nail polish. Do NOT wear lotions, powders, or perfumes.  You may wear deoderant. Do NOT shave for 48 hours prior to surgery. Do NOT bring valuables to the hospital. Contacts, dentures, or bridgework may not be worn into surgery. Leave suitcase in car.  After surgery it may be brought to your room.  For patients admitted to the hospital, checkout time is 11:00 AM the day of discharge.

## 2015-04-01 NOTE — Progress Notes (Signed)
Dr. Arby Barrette made aware of abnormal EKG.  Patient denies shortness of breath with exertion or chest pains.  Dr. Arby Barrette personally evaluated patient's exercise tolerance.  See his note for details.  Patient is cleared for surgery per Dr. Arby Barrette.  No follow up cardiac evaluation is recommended at this time.

## 2015-04-07 MED ORDER — GENTAMICIN IN SALINE 1.6-0.9 MG/ML-% IV SOLN
80.0000 mg | INTRAVENOUS | Status: AC
  Administered 2015-04-08: 80 mg via INTRAVENOUS
  Filled 2015-04-07: qty 50

## 2015-04-08 ENCOUNTER — Ambulatory Visit (HOSPITAL_COMMUNITY)
Admission: RE | Admit: 2015-04-08 | Discharge: 2015-04-09 | Disposition: A | Discharge status: Home / Self Care | Payer: BLUE CROSS/BLUE SHIELD | Source: Ambulatory Visit | Attending: Obstetrics and Gynecology | Admitting: Obstetrics and Gynecology

## 2015-04-08 ENCOUNTER — Inpatient Hospital Stay (HOSPITAL_COMMUNITY): Payer: BLUE CROSS/BLUE SHIELD | Admitting: Anesthesiology

## 2015-04-08 ENCOUNTER — Encounter (HOSPITAL_COMMUNITY): Payer: Self-pay

## 2015-04-08 ENCOUNTER — Encounter (HOSPITAL_COMMUNITY)
Admission: RE | Disposition: A | Discharge status: Home / Self Care | Payer: Self-pay | Source: Ambulatory Visit | Attending: Obstetrics and Gynecology

## 2015-04-08 DIAGNOSIS — R109 Unspecified abdominal pain: Secondary | ICD-10-CM | POA: Insufficient documentation

## 2015-04-08 DIAGNOSIS — Z87891 Personal history of nicotine dependence: Secondary | ICD-10-CM | POA: Insufficient documentation

## 2015-04-08 DIAGNOSIS — N8189 Other female genital prolapse: Principal | ICD-10-CM | POA: Insufficient documentation

## 2015-04-08 DIAGNOSIS — M810 Age-related osteoporosis without current pathological fracture: Secondary | ICD-10-CM | POA: Insufficient documentation

## 2015-04-08 DIAGNOSIS — Z6830 Body mass index (BMI) 30.0-30.9, adult: Secondary | ICD-10-CM | POA: Insufficient documentation

## 2015-04-08 DIAGNOSIS — D869 Sarcoidosis, unspecified: Secondary | ICD-10-CM | POA: Insufficient documentation

## 2015-04-08 DIAGNOSIS — N3946 Mixed incontinence: Secondary | ICD-10-CM | POA: Insufficient documentation

## 2015-04-08 DIAGNOSIS — E669 Obesity, unspecified: Secondary | ICD-10-CM | POA: Insufficient documentation

## 2015-04-08 DIAGNOSIS — R7303 Prediabetes: Secondary | ICD-10-CM | POA: Diagnosis not present

## 2015-04-08 DIAGNOSIS — M1990 Unspecified osteoarthritis, unspecified site: Secondary | ICD-10-CM | POA: Diagnosis not present

## 2015-04-08 DIAGNOSIS — K219 Gastro-esophageal reflux disease without esophagitis: Secondary | ICD-10-CM | POA: Diagnosis not present

## 2015-04-08 DIAGNOSIS — I2510 Atherosclerotic heart disease of native coronary artery without angina pectoris: Secondary | ICD-10-CM | POA: Insufficient documentation

## 2015-04-08 DIAGNOSIS — Z882 Allergy status to sulfonamides status: Secondary | ICD-10-CM | POA: Insufficient documentation

## 2015-04-08 DIAGNOSIS — I259 Chronic ischemic heart disease, unspecified: Secondary | ICD-10-CM | POA: Insufficient documentation

## 2015-04-08 DIAGNOSIS — Z885 Allergy status to narcotic agent status: Secondary | ICD-10-CM | POA: Insufficient documentation

## 2015-04-08 DIAGNOSIS — N819 Female genital prolapse, unspecified: Secondary | ICD-10-CM | POA: Diagnosis present

## 2015-04-08 DIAGNOSIS — I10 Essential (primary) hypertension: Secondary | ICD-10-CM | POA: Insufficient documentation

## 2015-04-08 HISTORY — PX: ANTERIOR AND POSTERIOR REPAIR WITH SACROSPINOUS FIXATION: SHX6536

## 2015-04-08 HISTORY — PX: VAGINAL HYSTERECTOMY: SHX2639

## 2015-04-08 HISTORY — PX: PUBOVAGINAL SLING: SHX1035

## 2015-04-08 HISTORY — PX: CYSTOSCOPY: SHX5120

## 2015-04-08 LAB — GLUCOSE, CAPILLARY
Glucose-Capillary: 132 mg/dL — ABNORMAL HIGH (ref 65–99)
Glucose-Capillary: 94 mg/dL (ref 65–99)

## 2015-04-08 SURGERY — HYSTERECTOMY, VAGINAL
Anesthesia: General | Site: Vagina

## 2015-04-08 MED ORDER — LACTATED RINGERS IV SOLN: INTRAVENOUS | Status: DC

## 2015-04-08 MED ORDER — LIDOCAINE-EPINEPHRINE (PF) 1 %-1:200000 IJ SOLN
INTRAMUSCULAR | Status: DC | PRN
  Administered 2015-04-08: 4 mL

## 2015-04-08 MED ORDER — GLYCOPYRROLATE 0.2 MG/ML IJ SOLN
INTRAMUSCULAR | Status: DC | PRN
  Administered 2015-04-08: 0.6 mg via INTRAVENOUS
  Administered 2015-04-08 (×2): 0.2 mg via INTRAVENOUS

## 2015-04-08 MED ORDER — BUPIVACAINE-EPINEPHRINE (PF) 0.5% -1:200000 IJ SOLN
INTRAMUSCULAR | Status: AC
  Filled 2015-04-08: qty 30

## 2015-04-08 MED ORDER — MENTHOL 3 MG MT LOZG: 1.0000 | LOZENGE | OROMUCOSAL | Status: DC | PRN

## 2015-04-08 MED ORDER — PHENYLEPHRINE 8 MG IN D5W 100 ML (0.08MG/ML) PREMIX OPTIME
INJECTION | INTRAVENOUS | Status: DC | PRN
  Administered 2015-04-08: 20 ug/min via INTRAVENOUS

## 2015-04-08 MED ORDER — FENTANYL CITRATE (PF) 250 MCG/5ML IJ SOLN
INTRAMUSCULAR | Status: DC | PRN
  Administered 2015-04-08 (×5): 50 ug via INTRAVENOUS

## 2015-04-08 MED ORDER — PROPOFOL 10 MG/ML IV BOLUS
INTRAVENOUS | Status: AC
  Filled 2015-04-08: qty 20

## 2015-04-08 MED ORDER — ROCURONIUM BROMIDE 100 MG/10ML IV SOLN
INTRAVENOUS | Status: AC
  Filled 2015-04-08: qty 1

## 2015-04-08 MED ORDER — NEOSTIGMINE METHYLSULFATE 10 MG/10ML IV SOLN
INTRAVENOUS | Status: DC | PRN
  Administered 2015-04-08: 3 mg via INTRAVENOUS

## 2015-04-08 MED ORDER — DEXAMETHASONE SODIUM PHOSPHATE 10 MG/ML IJ SOLN
INTRAMUSCULAR | Status: AC
  Filled 2015-04-08: qty 1

## 2015-04-08 MED ORDER — PHENAZOPYRIDINE HCL 200 MG PO TABS
200.0000 mg | ORAL_TABLET | Freq: Once | ORAL | Status: AC
  Administered 2015-04-08: 200 mg via ORAL
  Filled 2015-04-08: qty 1

## 2015-04-08 MED ORDER — ESTRADIOL 0.1 MG/GM VA CREA
TOPICAL_CREAM | VAGINAL | Status: DC | PRN
  Administered 2015-04-08: 1 via VAGINAL

## 2015-04-08 MED ORDER — SODIUM CHLORIDE 0.9 % IR SOLN
Freq: Once | Status: AC
  Administered 2015-04-08: 500 mL
  Filled 2015-04-08: qty 1

## 2015-04-08 MED ORDER — MEPERIDINE HCL 25 MG/ML IJ SOLN: 6.2500 mg | INTRAMUSCULAR | Status: DC | PRN

## 2015-04-08 MED ORDER — HYDROMORPHONE HCL 1 MG/ML IJ SOLN
INTRAMUSCULAR | Status: DC | PRN
  Administered 2015-04-08 (×2): 0.5 mg via INTRAVENOUS

## 2015-04-08 MED ORDER — ONDANSETRON HCL 4 MG/2ML IJ SOLN
INTRAMUSCULAR | Status: AC
  Filled 2015-04-08: qty 2

## 2015-04-08 MED ORDER — HYDROMORPHONE HCL 1 MG/ML IJ SOLN
INTRAMUSCULAR | Status: AC
  Filled 2015-04-08: qty 1

## 2015-04-08 MED ORDER — PROPOFOL 10 MG/ML IV BOLUS
INTRAVENOUS | Status: DC | PRN
  Administered 2015-04-08: 130 mg via INTRAVENOUS

## 2015-04-08 MED ORDER — ESTRADIOL 0.1 MG/GM VA CREA
TOPICAL_CREAM | VAGINAL | Status: AC
  Filled 2015-04-08: qty 42.5

## 2015-04-08 MED ORDER — STERILE WATER FOR IRRIGATION IR SOLN
Status: DC | PRN
  Administered 2015-04-08: 3000 mL

## 2015-04-08 MED ORDER — ROCURONIUM BROMIDE 100 MG/10ML IV SOLN
INTRAVENOUS | Status: DC | PRN
  Administered 2015-04-08: 10 mg via INTRAVENOUS
  Administered 2015-04-08: 40 mg via INTRAVENOUS

## 2015-04-08 MED ORDER — MIDAZOLAM HCL 2 MG/2ML IJ SOLN
INTRAMUSCULAR | Status: AC
  Filled 2015-04-08: qty 4

## 2015-04-08 MED ORDER — FENTANYL CITRATE (PF) 100 MCG/2ML IJ SOLN
INTRAMUSCULAR | Status: AC
  Filled 2015-04-08: qty 2

## 2015-04-08 MED ORDER — NEOSTIGMINE METHYLSULFATE 10 MG/10ML IV SOLN
INTRAVENOUS | Status: AC
  Filled 2015-04-08: qty 1

## 2015-04-08 MED ORDER — CEFAZOLIN SODIUM-DEXTROSE 2-3 GM-% IV SOLR
2.0000 g | INTRAVENOUS | Status: AC
  Administered 2015-04-08: 2 g via INTRAVENOUS

## 2015-04-08 MED ORDER — HYDROMORPHONE HCL 2 MG PO TABS
2.0000 mg | ORAL_TABLET | ORAL | Status: DC | PRN
  Administered 2015-04-08 – 2015-04-09 (×5): 2 mg via ORAL
  Filled 2015-04-08 (×5): qty 1

## 2015-04-08 MED ORDER — INSULIN ASPART 100 UNIT/ML ~~LOC~~ SOLN: 0.0000 [IU] | Freq: Three times a day (TID) | SUBCUTANEOUS | Status: DC

## 2015-04-08 MED ORDER — FENTANYL CITRATE (PF) 100 MCG/2ML IJ SOLN
INTRAMUSCULAR | Status: AC
  Administered 2015-04-08: 50 ug via INTRAVENOUS
  Filled 2015-04-08: qty 2

## 2015-04-08 MED ORDER — ONDANSETRON HCL 4 MG/2ML IJ SOLN
INTRAMUSCULAR | Status: DC | PRN
  Administered 2015-04-08: 4 mg via INTRAVENOUS

## 2015-04-08 MED ORDER — HYDROMORPHONE HCL 1 MG/ML IJ SOLN
0.5000 mg | INTRAMUSCULAR | Status: DC | PRN
  Administered 2015-04-08 (×2): 0.5 mg via INTRAVENOUS

## 2015-04-08 MED ORDER — LIDOCAINE HCL (CARDIAC) 20 MG/ML IV SOLN
INTRAVENOUS | Status: AC
  Filled 2015-04-08: qty 5

## 2015-04-08 MED ORDER — LIDOCAINE-EPINEPHRINE (PF) 1 %-1:200000 IJ SOLN
INTRAMUSCULAR | Status: AC
  Filled 2015-04-08: qty 30

## 2015-04-08 MED ORDER — LOSARTAN POTASSIUM 50 MG PO TABS
100.0000 mg | ORAL_TABLET | Freq: Every day | ORAL | Status: DC
  Filled 2015-04-08 (×2): qty 2

## 2015-04-08 MED ORDER — CEFAZOLIN SODIUM-DEXTROSE 2-3 GM-% IV SOLR
INTRAVENOUS | Status: AC
  Filled 2015-04-08: qty 50

## 2015-04-08 MED ORDER — DEXAMETHASONE SODIUM PHOSPHATE 4 MG/ML IJ SOLN
INTRAMUSCULAR | Status: DC | PRN
  Administered 2015-04-08: 4 mg via INTRAVENOUS

## 2015-04-08 MED ORDER — SCOPOLAMINE 1 MG/3DAYS TD PT72
MEDICATED_PATCH | TRANSDERMAL | Status: AC
  Administered 2015-04-08: 1.5 mg
  Filled 2015-04-08: qty 1

## 2015-04-08 MED ORDER — LIDOCAINE HCL (CARDIAC) 20 MG/ML IV SOLN
INTRAVENOUS | Status: DC | PRN
  Administered 2015-04-08: 80 mg via INTRAVENOUS

## 2015-04-08 MED ORDER — MIDAZOLAM HCL 2 MG/2ML IJ SOLN
INTRAMUSCULAR | Status: DC | PRN
  Administered 2015-04-08: 2 mg via INTRAVENOUS

## 2015-04-08 MED ORDER — ROCURONIUM BROMIDE 100 MG/10ML IV SOLN: INTRAVENOUS | Status: DC | PRN

## 2015-04-08 MED ORDER — INFLUENZA VAC SPLIT QUAD 0.5 ML IM SUSY
0.5000 mL | PREFILLED_SYRINGE | INTRAMUSCULAR | Status: AC
  Administered 2015-04-09: 0.5 mL via INTRAMUSCULAR

## 2015-04-08 MED ORDER — BUPIVACAINE HCL (PF) 0.25 % IJ SOLN
INTRAMUSCULAR | Status: AC
  Filled 2015-04-08: qty 30

## 2015-04-08 MED ORDER — FENTANYL CITRATE (PF) 100 MCG/2ML IJ SOLN
25.0000 ug | INTRAMUSCULAR | Status: DC | PRN
  Administered 2015-04-08 (×3): 50 ug via INTRAVENOUS

## 2015-04-08 MED ORDER — HYDROCODONE-ACETAMINOPHEN 5-325 MG PO TABS
1.0000 | ORAL_TABLET | Freq: Four times a day (QID) | ORAL | Status: DC | PRN
  Administered 2015-04-08: 1 via ORAL
  Filled 2015-04-08: qty 1

## 2015-04-08 MED ORDER — LACTATED RINGERS IV SOLN
INTRAVENOUS | Status: DC
Start: 2015-04-08 — End: 2015-04-08
  Administered 2015-04-08 (×2): via INTRAVENOUS

## 2015-04-08 MED ORDER — METOCLOPRAMIDE HCL 5 MG/ML IJ SOLN: 10.0000 mg | Freq: Once | INTRAMUSCULAR | Status: DC | PRN

## 2015-04-08 MED ORDER — FENTANYL CITRATE (PF) 250 MCG/5ML IJ SOLN
INTRAMUSCULAR | Status: AC
  Filled 2015-04-08: qty 25

## 2015-04-08 SURGICAL SUPPLY — 58 items
BLADE SURG 15 STRL LF C SS BP (BLADE) ×6 IMPLANT
BLADE SURG 15 STRL SS (BLADE) ×6
CANISTER SUCT 3000ML (MISCELLANEOUS) ×8 IMPLANT
CATH FOLEY 2WAY SLVR  5CC 16FR (CATHETERS) ×2
CATH FOLEY 2WAY SLVR 5CC 16FR (CATHETERS) ×2 IMPLANT
CATH ROBINSON RED A/P 16FR (CATHETERS) IMPLANT
CLOTH BEACON ORANGE TIMEOUT ST (SAFETY) ×4 IMPLANT
CONT PATH 16OZ SNAP LID 3702 (MISCELLANEOUS) IMPLANT
COVER MAYO STAND STRL (DRAPES) ×4 IMPLANT
DECANTER SPIKE VIAL GLASS SM (MISCELLANEOUS) ×8 IMPLANT
DEVICE CAPIO SLIM SINGLE (INSTRUMENTS) IMPLANT
DRAIN PENROSE 1/4X12 LTX (DRAIN) ×4 IMPLANT
DRAPE UNDERBUTTOCKS STRL (DRAPE) ×4 IMPLANT
GAUZE PACKING 1 X5 YD ST (GAUZE/BANDAGES/DRESSINGS) IMPLANT
GAUZE PACKING 2X5 YD STRL (GAUZE/BANDAGES/DRESSINGS) ×4 IMPLANT
GLOVE BIO SURGEON STRL SZ 6.5 (GLOVE) ×3 IMPLANT
GLOVE BIO SURGEON STRL SZ7.5 (GLOVE) ×12 IMPLANT
GLOVE BIO SURGEONS STRL SZ 6.5 (GLOVE) ×1
GLOVE BIOGEL PI IND STRL 6.5 (GLOVE) ×2 IMPLANT
GLOVE BIOGEL PI IND STRL 8 (GLOVE) ×4 IMPLANT
GLOVE BIOGEL PI INDICATOR 6.5 (GLOVE) ×2
GLOVE BIOGEL PI INDICATOR 8 (GLOVE) ×4
GOWN STRL REUS W/TWL LRG LVL3 (GOWN DISPOSABLE) ×16 IMPLANT
LIQUID BAND (GAUZE/BANDAGES/DRESSINGS) ×4 IMPLANT
NEEDLE HYPO 22GX1.5 SAFETY (NEEDLE) ×4 IMPLANT
NEEDLE HYPO 25X5/8 SAFETYGLIDE (NEEDLE) ×4 IMPLANT
NEEDLE SPNL 22GX3.5 QUINCKE BK (NEEDLE) ×4 IMPLANT
NS IRRIG 1000ML POUR BTL (IV SOLUTION) ×8 IMPLANT
PACK VAGINAL WOMENS (CUSTOM PROCEDURE TRAY) ×4 IMPLANT
PAD OB MATERNITY 4.3X12.25 (PERSONAL CARE ITEMS) ×4 IMPLANT
PENCIL BUTTON HOLSTER BLD 10FT (ELECTRODE) ×4 IMPLANT
PLUG CATH AND CAP STER (CATHETERS) ×4 IMPLANT
RETRACTOR STAY HOOK 5MM (MISCELLANEOUS) IMPLANT
SET CYSTO W/LG BORE CLAMP LF (SET/KITS/TRAYS/PACK) ×4 IMPLANT
SHEET LAVH (DRAPES) ×4 IMPLANT
SLING SUPRIS RETROPUBIC KIT (SLING) ×4 IMPLANT
SUT CAPIO ETHIBPND (SUTURE) IMPLANT
SUT CAPIO POLYGLYCOLIC (SUTURE) IMPLANT
SUT PROLENE 1 CT 1 30 (SUTURE) IMPLANT
SUT SILK 2 0 FS (SUTURE) IMPLANT
SUT VIC AB 0 CT1 18XCR BRD8 (SUTURE) ×4 IMPLANT
SUT VIC AB 0 CT1 27 (SUTURE) ×6
SUT VIC AB 0 CT1 27XBRD ANBCTR (SUTURE) ×6 IMPLANT
SUT VIC AB 0 CT1 8-18 (SUTURE) ×4
SUT VIC AB 2-0 CT2 27 (SUTURE) IMPLANT
SUT VIC AB 2-0 SH 27 (SUTURE) ×4
SUT VIC AB 2-0 SH 27XBRD (SUTURE) ×4 IMPLANT
SUT VIC AB 2-0 UR5 27 (SUTURE) IMPLANT
SUT VIC AB 3-0 SH 27 (SUTURE)
SUT VIC AB 3-0 SH 27X BRD (SUTURE) IMPLANT
SUT VIC AB 4-0 PS2 27 (SUTURE) IMPLANT
SUT VICRYL 0 TIES 12 18 (SUTURE) ×4 IMPLANT
TOWEL OR 17X24 6PK STRL BLUE (TOWEL DISPOSABLE) ×16 IMPLANT
TRAY FOLEY CATH SILVER 14FR (SET/KITS/TRAYS/PACK) ×8 IMPLANT
TUBING NON-CON 1/4 X 20 CONN (TUBING) ×6 IMPLANT
TUBING NON-CON 1/4 X 20' CONN (TUBING) ×2
WATER STERILE IRR 1000ML POUR (IV SOLUTION) ×8 IMPLANT
supris retropubic slin (Sling) ×4 IMPLANT

## 2015-04-08 NOTE — Progress Notes (Signed)
Patient IV in L Hand accidentally dislodged.  Dr. Vincente Poli contacted via telephone.  Reported dislodgement and patient tolerating PO fluids and medications well.  Verbal order to d/c IV at this time; no restart of IV.

## 2015-04-08 NOTE — H&P (Signed)
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 She sometimes leaks with coughing and sneezing. She sometimes has urge incontinence, especially when she just gets to the restroom. She denies enuresis. She wears 1 pad a day, and I think her incontinence tends to be milder.   She voids every 2-3 hours and can hold urination for 2 hours or longer. She reports a good flow.     Review of systems: No change in bowel or neurologic systems.     Nocturia and incontinence stable.      Briana Berry on her last visit described vaginal bulging. She has not had a hysterectomy. She had a grade 2 cystocele with a mild central defect. I thought it may come down further especially when she is lifting heavy. Her uterus descended approximately 2 cm. She had a negative cough test with minimal to no rectocele. Dr Vincente Poli felt she may benefit from an anterior repair and/or sling and hysterectomy. She had moderate nocturia. I felt if she had a hysterectomy she would likely benefit from an anterior repair. I did not think she would need a vault suspension especially with fixation of the cuff to the uterosacral ligament. She would still be consented for such with or without graft.  She was here to discuss urodynamics. She did not void prior to the study and was catheterized for 150 mL. Maximum capacity was 170 mL. Bladder was unstable reaching pressures of 23 cmH2O, and she did not leak. She did not leak with Valsalva pressure of 80 cmH2O. During voluntary voiding she voided 272 mL with max flow of 15 mL/second. Maximum voiding pressure was 15 cmH2O, and she emptied efficiently. She voided with an interrupted pattern noted. Bladder neck descended approximately 3 cm. Cystocele was not that impressive. X-ray <28>quality during test described. The details of the urodynamics were signed and dictated on the urodynamics sheet   Past  Medical History Problems  1. History of arthritis (Z87.39) 2. History of esophageal reflux (Z87.19) 3. History of hypertension (Z86.79)  Surgical History Problems  1. History of Heart Surgery  Current Meds 1. Alendronate Sodium TABS;  Therapy: (Recorded:18May2016) to Recorded 2. Aspirin 81 MG TABS;  Therapy: (Recorded:18May2016) to Recorded 3. Folic Acid TABS;  Therapy: (Recorded:18May2016) to Recorded 4. Hydrocodone-Acetaminophen TABS;  Therapy: (Recorded:18May2016) to Recorded 5. Methotrexate Sodium SOLR;  Therapy: (Recorded:18May2016) to Recorded  Allergies Medication  1. No Known Drug Allergies  Family History Problems  1. Family history of malignant neoplasm (Z80.9) 2. Family history of myocardial infarction (Z82.49) : Father  Social History Problems  1. Daily caffeine consumption   2 per day 2. Employed   Location manager 3. Former smoker [phone])   1 pack a day for 5 years, quit 35 years ago as of 11/13/14 office visit. 4. Two children   1 son and 1 daughter  Assessment Assessed  1. Urge and stress incontinence (N39.46) 2. Nocturia (R35.1)  Plan Urge and stress incontinence  1. Follow-up PRN Office  Follow-up  Status: Complete  Done: 09Jun2016  Discussion/Summary   I drew Briana Berry a picture. If she was not having prolapse surgery or a hysterectomy I would certainly like to try to help her nonsurgically with pelvic floor and behavioral therapy and at least short term medication. We talked about watchful waiting as well versus sling.  We talked about a sling in detail. Pros, cons, general surgical and anesthetic risks, and other  options including behavioral therapy and watchful waiting were discussed. She understands that slings are generally successful in 90% of cases for stress incontinence, 50% for urge incontinence, and that in a small percentage of cases the incontinence can worsen. The risk of persistent, de novo, or worsening  incontinence/dysfunction was discussed. Risks were described but not limited to the discussion of injury to neighboring structures including the bowel (with possible life-threatening sepsis and colostomy), bladder, urethra, vagina (all resulting in further surgery), and ureter (resulting in re-implantation). We also talked about the risk of retention requiring urethrolysis, extrusion requiring revision, and erosion resulting in further surgery. Bleeding risks and transfusion rates and the risk of infection were discussed. The risk of pelvic and abdominal pain syndromes, dyspareunia, and neuropathies were discussed. The need for CIC was described as well as the usual postoperative course. The patient understands that she might not reach her treatment goal and that she might be worse following surgery. Mesh TV issues were discussed.  We talked about watchful waiting versus pessary versus a cystocele repair and/or vault suspension and/or graft in combination with a hysterectomy.  I drew her a picture and we talked about prolapse surgery in detail. Pros, cons, general surgical and anesthetic risks, and other options including behavioral therapy, pessaries, and watchful waiting were discussed. She understands that prolapse repairs are successful in 80-85% of cases for prolapse symptoms and can recur anteriorly, posteriorly, and/or apically. She understands that in most cases I use a graft and general risks were discussed. Surgical risks were described but not limited to the discussion of injury to neighboring structures including the bowel (with possible life-threatening sepsis and colostomy), bladder, urethra, vagina (all resulting in further surgery), and ureter (resulting in re-implantation). We talked about injury to nerves/soft tissue leading to debilitating and intractable pelvic, abdominal, and lower extremity pain syndromes and neuropathies. The risks of buttock pain, intractable dyspareunia, and vaginal  narrowing and shortening with sequelae were discussed. Bleeding risks, transfusion rates, and infection were discussed. The risk of persistent, de novo, or worsening bladder and/or bowel incontinence/dysfunction was discussed. The need for CIC was described as well the usual post-operative course. The patient understands that she might not reach her treatment goal and that she might be worse following surgery.   Mesh issues described. She would like to be sexually active in the future. She does not want a pessary. She wants to think about it, and I was not surprised that she felt a bit overwhelmed with risks, etc. She says her main problem that she absolutely will not be able to decrease heavy straining or lifting for 8-12 weeks after surgery. She wants to think about it and see Dr Vincente Poli. I will send a copy of this note to Dr Vincente Poli and wait to hear back. I think she was also frightened or concerned about the sling. She did not confirm if she would like to have a sling at the same time.  I can certainly empathize with Briana Berry, but I think she has a good feel for the surgery and it is relative risks, etc.   cc: Marcelle Overlie, MD   Briana Berry had some questions regarding hot flashes that I could not answer. She understands that statistically prolapse repairs do better with a hysterectomy in this circumstance, but the issue has become a little bit more controversial.  After a thorough review of the management options for the patient's condition the patient  elected to proceed with surgical therapy as noted above. We  have discussed the potential benefits and risks of the procedure, side effects of the proposed treatment, the likelihood of the patient achieving the goals of the procedure, and any potential problems that might occur during the procedure or recuperation. Informed consent has been obtained.

## 2015-04-08 NOTE — Anesthesia Postprocedure Evaluation (Signed)
  Anesthesia Post-op Note  Patient: Briana Berry  Procedure(s) Performed: Procedure(s): Total HYSTERECTOMY VAGINAL (N/A) ANTERIOR AND REPAIR WIith perinealplasty (N/A) CYSTOSCOPY (N/A) SUPUS SLING (N/A)  Patient Location: [facility]  Anesthesia Type:General  Level of Consciousness: awake  Airway and Oxygen Therapy: Patient Spontanous Breathing  Post-op Pain: mild  Post-op Assessment: Patient's Cardiovascular Status Stable and Respiratory Function Stable              Post-op Vital Signs: stable  Last Vitals:  Filed Vitals:   04/08/15 1405  BP: 120/57  Pulse: 51  Temp: 36.5 C  Resp: 16    Complications: No apparent anesthesia complications

## 2015-04-08 NOTE — Op Note (Signed)
Preoperative diagnosis: Stress urinary incontinence Postoperative diagnosis: Stress urinary incontinence Procedure: Sling cystourethropexy and Cysto Surgeon: Dr. Lorin Picket Brennin Durfee Asstistant: Dr Greggory Brandy Estimated blood loss: < 50 milliliters  The patient was prepped and draped in the usual fashion. Extra care was taken with leg positioning to minimize the  risk of compartment syndrome, neuropathy, and deep vein thrombosis. Preoperative laboratory tests were normal and preoperative antibiotics were given.  Two less than 1 cm incisions were made 1 fingerbreadth above the symphysis pubis 1.5 cm lateral to the midline. A 2 cm appropriate depth suburethral incision was made underneath the mid urethra after instilling approximately 5 cc of 1% lidocaine mixture. I sharply and bluntly dissected to the urethral vesical angle bilaterally.  With the bladder empty I passed a SUPRES needle on top of and along the back of the symphysis pubis staying  on the periosteum and staying lateral using my box technique and delivering the needle onto the pulp of my  index finger bilaterally.  I cystoscoped the patient thoroughly and there was no injury to the bladder or urethra. There was no movement  or indentation of the bladder with movement of the trocar. There was excellent efflux of yellow urine bilaterally.  With the bladder emptied I attached the sling and brought it up through the retropubic space bilaterally. I tensioned it over the fat part of a moderate size Kelly clamp. I cut below the blue dots, irrigated the sheaths, and removed the sheaths. I was very happy with the position and tension of the sling With appropriate hypermobility and no springback effect.  All incisions were irrigated. The sling was cut below the skin level. I closed the anterior vaginal wall with  running 2-0 Vicryl followed by my interrupted sutures. Interrupted 4-0 Vicryl was used for the abdominal incisions.  Foley  inserted and vaginal pack.  The patient was taken to the recovery room and hopefully this procedure will reach her treatment goal.

## 2015-04-08 NOTE — Transfer of Care (Signed)
Immediate Anesthesia Transfer of Care Note  Patient: Briana Berry  Procedure(s) Performed: Procedure(s): Total HYSTERECTOMY VAGINAL (N/A) ANTERIOR AND REPAIR WIith perinealplasty (N/A) CYSTOSCOPY (N/A) SUPUS SLING (N/A)  Patient Location: PACU  Anesthesia Type:General  Level of Consciousness: awake, alert  and oriented  Airway & Oxygen Therapy: Patient Spontanous Breathing and Patient connected to nasal cannula oxygen  Post-op Assessment: Report given to RN and Post -op Vital signs reviewed and stable  Post vital signs: Reviewed and stable  Last Vitals: 102/56- 53-16-98.1 Sp02 100% on 3L nasal cannula.   Complications: No apparent anesthesia complications  PACU monitoring continues with 5 lead ECG and arterial line blood pressure. Patient awake and conversant.

## 2015-04-08 NOTE — Anesthesia Procedure Notes (Signed)
Procedure Name: Intubation Date/Time: 04/08/2015 7:47 AM Performed by: Graciela Husbands Pre-anesthesia Checklist: Patient identified, Patient being monitored, Emergency Drugs available, Timeout performed and Suction available Patient Re-evaluated:Patient Re-evaluated prior to inductionOxygen Delivery Method: Circle system utilized Preoxygenation: Pre-oxygenation with 100% oxygen Intubation Type: IV induction Ventilation: Mask ventilation without difficulty Laryngoscope Size: Mac and 3 Grade View: Grade I Tube type: Oral Tube size: 7.0 mm Number of attempts: 1 Airway Equipment and Method: Stylet (5 bath blankets under upper back/head.) Placement Confirmation: ETT inserted through vocal cords under direct vision,  breath sounds checked- equal and bilateral and positive ETCO2 Secured at: 21 cm Tube secured with: Tape Dental Injury: Teeth and Oropharynx as per pre-operative assessment

## 2015-04-08 NOTE — Brief Op Note (Signed)
04/08/2015  9:04 AM  PATIENT:  Briana Berry  59 y.o. female  PRE-OPERATIVE DIAGNOSIS:  PELVIC ORGAN PROLAPSE  POST-OPERATIVE DIAGNOSIS:  Pelvic organ prolapse  PROCEDURE: Total Vaginal hysterectomy Anterior Repair Perineoplasty  SURGEON:  Surgeon(s) and Role: Panel 1:    * Harold Hedge, MD - Assisting    * Marcelle Overlie, MD - Primary  Panel 2:    * Alfredo Martinez, MD - Primary  PHYSICIAN ASSISTANT:   ASSISTANTS: none   ANESTHESIA:   general  EBL:  Total I/O In: 1000 [I.V.:1000] Out: 250 [Urine:150; Blood:100]  BLOOD ADMINISTERED:none  DRAINS: Urinary Catheter (Foley)   LOCAL MEDICATIONS USED:  NONE  SPECIMEN:  Source of Specimen:  uterus and cervix  DISPOSITION OF SPECIMEN:  PATHOLOGY  COUNTS:  YES  TOURNIQUET:  * No tourniquets in log *  DICTATION: .Other Dictation: Dictation Number [id number]  PLAN: Overnight observation PATIENT DISPOSITION:  PACU - hemodynamically stable.   Delay start of Pharmacological VTE agent (>24hrs) due to surgical blood loss or risk of bleeding: not applicable

## 2015-04-08 NOTE — Addendum Note (Signed)
Addendum  created 04/08/15 1530 by Renford Dills, CRNA   Modules edited: Notes Section   Notes Section:  File: [id number]

## 2015-04-08 NOTE — Anesthesia Postprocedure Evaluation (Signed)
  Anesthesia Post-op Note  Patient: Briana Berry  Procedure(s) Performed: Procedure(s): Total HYSTERECTOMY VAGINAL (N/A) ANTERIOR AND REPAIR WIith perinealplasty (N/A) CYSTOSCOPY (N/A) SUPUS SLING (N/A)  Patient Location: PACU  Anesthesia Type:General  Level of Consciousness: awake, alert  and oriented  Airway and Oxygen Therapy: Patient Spontanous Breathing and Patient connected to nasal cannula oxygen  Post-op Pain: none  Post-op Assessment: Post-op Vital signs reviewed, Patient's Cardiovascular Status Stable, Respiratory Function Stable, Patent Airway, No signs of Nausea or vomiting and Pain level controlled              Post-op Vital Signs: Reviewed and stable  Last Vitals:  Filed Vitals:   04/08/15 1045  BP: 98/58  Pulse: 54  Temp:   Resp: 21    Complications: No apparent anesthesia complications

## 2015-04-08 NOTE — Progress Notes (Signed)
H and P on the chart No significant  changes Will proceed with TVH , Possible AP Repair / Perineoplasty / Cystoscopy/ Sling  Consent signed

## 2015-04-08 NOTE — Progress Notes (Signed)
Dr. Vincente Poli in department.  Update on pain c/o given. Patient tolerating PO fluids and PO med well.  Patient states relief from PO medication. No new orders at this time.

## 2015-04-09 DIAGNOSIS — N8189 Other female genital prolapse: Secondary | ICD-10-CM | POA: Diagnosis not present

## 2015-04-09 LAB — CBC
HEMATOCRIT: 37.2 % (ref 36.0–46.0)
Hemoglobin: 12.2 g/dL (ref 12.0–15.0)
MCH: 29.1 pg (ref 26.0–34.0)
MCHC: 32.8 g/dL (ref 30.0–36.0)
MCV: 88.8 fL (ref 78.0–100.0)
PLATELETS: 194 10*3/uL (ref 150–400)
RBC: 4.19 MIL/uL (ref 3.87–5.11)
RDW: 14.4 % (ref 11.5–15.5)
WBC: 14.8 10*3/uL — AB (ref 4.0–10.5)

## 2015-04-09 LAB — BASIC METABOLIC PANEL
Anion gap: 4 — ABNORMAL LOW (ref 5–15)
BUN: 9 mg/dL (ref 6–20)
CHLORIDE: 107 mmol/L (ref 101–111)
CO2: 27 mmol/L (ref 22–32)
CREATININE: 0.63 mg/dL (ref 0.44–1.00)
Calcium: 8.5 mg/dL — ABNORMAL LOW (ref 8.9–10.3)
Glucose, Bld: 99 mg/dL (ref 65–99)
POTASSIUM: 4 mmol/L (ref 3.5–5.1)
SODIUM: 138 mmol/L (ref 135–145)

## 2015-04-09 LAB — GLUCOSE, CAPILLARY
GLUCOSE-CAPILLARY: 112 mg/dL — AB (ref 65–99)
GLUCOSE-CAPILLARY: 87 mg/dL (ref 65–99)
Glucose-Capillary: 110 mg/dL — ABNORMAL HIGH (ref 65–99)

## 2015-04-09 MED ORDER — HYDROMORPHONE HCL 2 MG PO TABS: 2.0000 mg | ORAL_TABLET | ORAL | Status: DC | PRN

## 2015-04-09 NOTE — Discharge Summary (Signed)
  Admission Diagnosis: Pelvic Organ Prolapse.  Post op Diagnosis: Same  Hospital Course: 59 year old female with prolapse. Underwent TVH, Anterior Repair, Perineoplasty and Sling. She did very well post op. Post op hemoglobin is normal. By POD #1 she was doing very well. She was voiding but had residuals and had to self cath per Dr. Mina MarbleMacDiarmid's instructions. She was given discharge instructions. No driving for 1 week. Follow up with me in 2 weeks. Rx Dilaudid given to patient

## 2015-04-09 NOTE — Op Note (Signed)
NAMEMarland Kitchen  EVERLI, ROTHER                 ACCOUNT NO.:  [id number]  MEDICAL RECORD NO.:  [id number]  LOCATION:  9310                          FACILITY:  [facility]  PHYSICIAN:  Ertha Nabor L. Shandrell Boda, M.D.DATE OF BIRTH:  May 26, 1956  DATE OF PROCEDURE:  04/08/2015 DATE OF DISCHARGE:                              OPERATIVE REPORT   PREOPERATIVE DIAGNOSIS:  Pelvic organ prolapse.  POSTOPERATIVE DIAGNOSIS:  Pelvic organ prolapse.  PROCEDURES:  Total vaginal hysterectomy, anterior colporrhaphy, and perineoplasty.  SURGEON:  Jurney Overacker L. Vincente Poli, M.D.  ASSISTANT:  Guy Sandifer. Henderson Cloud, M.D.  ANESTHESIA:  General.  ESTIMATED BLOOD LOSS:  100 mL.  URINE OUTPUT:  150 mL of concentrated urine.  DESCRIPTION OF PROCEDURE:  The patient was taken to the operating room. She was intubated after an A line was appropriately inserted and her intubation and induction of anesthesia were uncomplicated.  She was then prepped and draped.  A Foley catheter was inserted.  Time-out had been performed.  She was examined under anesthesia and she had a grade 2 to 3 uterine prolapse and grade 3 cystocele.  She had some relaxation of the perineum and the vaginal introitus.  The decision was to proceed with a TVH, anterior colporrhaphy, and perineoplasty.  I did not feel like she needed a posterior repair, she has adequate support of the posterior vaginal wall.  A weighted speculum was placed in the vagina.  The cervix was grasped with a tenaculum and a circumferential incision was made around the cervix.  Posterior cul-de-sac was entered sharply using Metzenbaum scissors.  An anterior cul-de-sac was entered sharply using Metzenbaum scissors as well.  There were some adhesions noted in the cul- de-sac which appeared to be omental adhesions on the right side which we stayed well away from.  We placed curved Heaney clamp across the uterosacral cardinal ligaments on either side.  Each pedicle was clamped, cut, and suture ligated  using 0 Vicryl suture.  We walked our way up the broad ligament, staying snug beside the cervix and uterus. Each pedicle was clamped, cut, and suture ligated using 0 Vicryl suture. The uterus was retroflexed because it was relatively small and the remainder of the broad ligament was clamped using Heaney clamp and it was clamped, cut, and suture ligated using 0 Vicryl suture and then further secured with a free tie of 0 Vicryl suture.  Hemostasis was very good.  The angle stitches were placed at 3 and 9 o'clock at the vaginal cuff using 0 Vicryl suture.  She had a very good support of the uterosacral cardinal ligament and vaginal vault.  The posterior cuff was then closed in a running and locked using 0 Vicryl suture and then the posterior 2/3 of the cuff was closed using 0 Vicryl running locked stitch.  At this point, we proceeded with a colporrhaphy where I placed Allis clamps at the angles on either side, making a midline incision at the midline of the vaginal epithelium, dissecting the vesicovaginal fascia away from the overlying vaginal epithelium, then closing and reducing the cystocele gently using a pursestring using 0 Vicryl suture. We were careful not to over correct this, so that we did not  bunch the bladder in the ureters.  The redundant vaginal epithelium was then trimmed and the vaginal epithelium was closed using a running locked stitch using 0 Vicryl suture.  At this point, we then performed a small perineoplasty where I made a small V-shaped incision in the vagina with a scalpel, made a small incision of the vaginal canal approximately 2 to 2.5 cm using Metzenbaum scissors.  We then placed an 0 Vicryl stitch at the perineal body and reinforced the perineum and then closed the incision using 2-0 Vicryl in a running stitch.  At that point, she had excellent reapproximation of the perineum and reduction of the perineum. At this point, all sponge, lap, and instrument counts  were correct x2. No excessive bleeding was noted.  I then scrubbed out as well as Dr. Henderson Cloudomblin.  The patient was hemodynamically stable.  Dr. Sherron MondayMacDiarmid and his assistant were then scrubbed in and performed the remainder of the case.     Noura Purpura L. Vincente PoliGrewal, M.D.     Florestine AversMLG/MEDQ  D:  04/08/2015  T:  04/09/2015  Job:  [id number]

## 2015-04-09 NOTE — Progress Notes (Signed)
Vitals and labs normal D/c foley and vaginal pack  Voiding trial Abdominal pain thru night Belly soft Otherwise stable

## 2015-04-09 NOTE — Progress Notes (Signed)
S:  Patient is doing well. Ate Breakfast. Packing and foley out. Pain Diminished.  O:  BP 131/76 mmHg  Pulse 65  Temp(Src) 98 F (36.7 C) (Oral)  Resp 18  Ht 5\' 7"  (1.702 m)  Wt 88.451 kg (195 lb)  BMI 30.53 kg/m2  SpO2 100% Results for orders placed or performed during the hospital encounter of 04/08/15 (from the past 24 hour(s))  Glucose, capillary     Status: Abnormal   Collection Time: 04/08/15 10:18 AM  Result Value Ref Range   Glucose-Capillary 132 (H) 65 - 99 mg/dL  Glucose, capillary     Status: None   Collection Time: 04/08/15  6:00 PM  Result Value Ref Range   Glucose-Capillary 94 65 - 99 mg/dL  Glucose, capillary     Status: Abnormal   Collection Time: 04/09/15 12:17 AM  Result Value Ref Range   Glucose-Capillary 112 (H) 65 - 99 mg/dL  Basic metabolic panel     Status: Abnormal   Collection Time: 04/09/15  5:26 AM  Result Value Ref Range   Sodium 138 135 - 145 mmol/L   Potassium 4.0 3.5 - 5.1 mmol/L   Chloride 107 101 - 111 mmol/L   CO2 27 22 - 32 mmol/L   Glucose, Bld 99 65 - 99 mg/dL   BUN 9 6 - 20 mg/dL   Creatinine, Ser 4.690.63 0.44 - 1.00 mg/dL   Calcium 8.5 (L) 8.9 - 10.3 mg/dL   GFR calc non Af Amer >60 >60 mL/min   GFR calc Af Amer >60 >60 mL/min   Anion gap 4 (L) 5 - 15  CBC     Status: Abnormal   Collection Time: 04/09/15  5:26 AM  Result Value Ref Range   WBC 14.8 (H) 4.0 - 10.5 K/uL   RBC 4.19 3.87 - 5.11 MIL/uL   Hemoglobin 12.2 12.0 - 15.0 g/dL   HCT 62.937.2 52.836.0 - 41.346.0 %   MCV 88.8 78.0 - 100.0 fL   MCH 29.1 26.0 - 34.0 pg   MCHC 32.8 30.0 - 36.0 g/dL   RDW 24.414.4 01.011.5 - 27.215.5 %   Platelets 194 150 - 400 K/uL  Glucose, capillary     Status: None   Collection Time: 04/09/15  7:41 AM  Result Value Ref Range   Glucose-Capillary 87 65 - 99 mg/dL   Abdomen is soft and non tender  IMPRESSION: POD #1 Doing well PLAN: Voiding trial Ambulate Will evaluate at lunchtime and if doing well plan discharge at that time.

## 2015-04-09 NOTE — Progress Notes (Signed)
Patient is voiding small amounts but having residuals.   Pain is minimal. Ate normal amount of lunch. Ok to discharge Voiding instructions per Dr. Sherron MondayMacDiarmid Follow up in 1 to 2 weeks with me.

## 2015-04-10 ENCOUNTER — Encounter (HOSPITAL_COMMUNITY): Payer: Self-pay | Admitting: Obstetrics and Gynecology

## 2015-09-19 ENCOUNTER — Ambulatory Visit: Payer: BLUE CROSS/BLUE SHIELD | Admitting: Internal Medicine

## 2015-09-22 ENCOUNTER — Encounter: Payer: Self-pay | Admitting: Internal Medicine

## 2015-09-22 ENCOUNTER — Ambulatory Visit (INDEPENDENT_AMBULATORY_CARE_PROVIDER_SITE_OTHER)
Admission: RE | Admit: 2015-09-22 | Discharge: 2015-09-22 | Disposition: A | Discharge status: Home / Self Care | Payer: BLUE CROSS/BLUE SHIELD | Source: Ambulatory Visit | Attending: Internal Medicine | Admitting: Internal Medicine

## 2015-09-22 ENCOUNTER — Ambulatory Visit (INDEPENDENT_AMBULATORY_CARE_PROVIDER_SITE_OTHER): Payer: BLUE CROSS/BLUE SHIELD | Admitting: Internal Medicine

## 2015-09-22 VITALS — BP 118/80 | HR 70 | Ht 66.0 in | Wt 197.0 lb

## 2015-09-22 DIAGNOSIS — D869 Sarcoidosis, unspecified: Secondary | ICD-10-CM | POA: Diagnosis not present

## 2015-09-22 NOTE — Assessment & Plan Note (Signed)
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 when ? 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    Please see instructions for details which were reviewed in writing and the patient given a copy highlighting the part that I personally wrote and discussed at today's ov.

## 2015-09-22 NOTE — Patient Instructions (Signed)
Please remember to go to the   x-ray department downstairs for your tests - we will call you with the results when they are available.  Please schedule a follow up visit in 12 months but call sooner if needed     

## 2015-09-22 NOTE — Progress Notes (Signed)
Quick Note:  Spoke with pt and notified of results per Dr. Wert. Pt verbalized understanding and denied any questions.  ______ 

## 2015-09-22 NOTE — Progress Notes (Signed)
Subjective:     Patient ID: Briana QuanMary C Swaggerty, female   DOB: 09/22/1955 .   MRN: [id number]    Brief patient profile:  [id number]  yobf remote smoker with sarcoidosis chronically that had been prednisone dependent since 1997.Marland Kitchen. Historically when tapered of prednisone below 10 mg daily > flare up of either arthritis or cough/sob.  Finally tapered off April 2013    History of Present Illness  12/20/2011 Acute OV / NP/ discuss prednisone . She has been on chronic steroids on /off for last 15 years. Was able to get off for 4 months in 2011 but went back on due to joint aches.  Was weaned down 5mg  05/2011, she further tapered herself off 2-3 months ago.  No flare in cough or dyspnea. No rash. No visual issues.  Since off steroids does have joint aches on/off. Esp in knees.  rec Referred  to rheumatology > Dr Dareen PianoAnderson> d/c prednisone and started mtx effective July 2013    08/17/2013 f/u ov/Jesselyn Rask re: sarcoid on mtx per Dr Dareen PianoAnderson Chief Complaint  Patient presents with  . Followup with PFT    Pt c/o cough x 5 days. Cough is non prod and has caused her throat to feel irritated and have sore chest.   Was fine prior to onset of apparent uri / no longer on ppi/ no fever, aches, rash, sob. rec For cough is mucinex dm 1200 every 12 hours and vicodin one every 4 hours if needed for bad cough or painful cough and always remember when coughing for any reason to hold the fosfamax and take prilosec Take 30- 60 min before your first and last meals of the day  Prednisone 10 mg take  4 each am x 2 days,   2 each am x 2 days,  1 each am x 2 days and stop Augmentin 875 mg take one pill twice daily  X 10 days    09/19/2014 f/u ov/Jullisa Grigoryan re: sarcoidosis/ off pred since 2013 / on  mtx per Dr Dareen PianoAnderson  Chief Complaint  Patient presents with  . Follow-up    Pt c/o cough "since the weather change"- she states normally has trouble with allergies in the Spring. Her cough is non prod.  Her breathing is doing well.  She states has occ  "bumps" on her arms.   already started clariton helps nasal symptoms some/no purulent secretions or sinus pain  Skin changes both elbows wax and wane rec Prednisone 10 mg take  4 each am x 2 days,   2 each am x 2 days,  1 each am x 2 days and stop Be sure to tell Dr Dareen PianoAnderson whether the prednisone helped your arthritis or respiratory symptoms even if only temporarily    09/22/2015  f/u ov/Lillie Portner re: sarcoid/ mtx dep per Kathi LudwigSyed now  Chief Complaint  Patient presents with  . Follow-up    Doing well. She had a rash around her neck a few wks ago- resolved after using neosporin.   now doing exercise = walk aerobics x 30 min = 2 miles flat No rash.  No obvious day to day or daytime variabilty or assoc  cp or chest tightness, subjective wheeze overt sinus or hb symptoms. No unusual exp hx or h/o childhood pna/ asthma or knowledge of premature birth.  Sleeping ok without nocturnal  or early am exacerbation  of respiratory  c/o's or need for noct saba. Also denies any obvious fluctuation of symptoms with weather or environmental changes or other  aggravating or alleviating factors except as outlined above   Current Medications, Allergies, Complete Past Medical History, Past Surgical History, Family History, and Social History were reviewed in [employer] record.  ROS  The following are not active complaints unless bolded sore throat, dysphagia, dental problems, itching, sneezing,  nasal congestion or excess/ purulent secretions, ear ache,   fever, chills, sweats, unintended wt loss, pleuritic or exertional cp, hemoptysis,  orthopnea pnd or leg swelling, presyncope, palpitations, heartburn, abdominal pain, anorexia, nausea, vomiting, diarrhea  or change in bowel or urinary habits, change in stools or urine, dysuria,hematuria,  rash, arthralgias usual pattern and severity   , visual complaints, headache, numbness weakness or ataxia or problems with walking or coordination,  change in  mood/affect or memory.            Past Medical History:  IHD  -left heart catheter 4/94 and 95% LAD reduced to 10% with PCA  -R./S. Myoview negative for ischemia ejection fraction 67% 06/25/04  HBP  SARCOIDOSIS dx by skin bx......................................................................................Marland KitchenWert / Kathi Ludwig  (Rheum) - Prednisone dep 1997 to April 2013  - Try off prednisone October 13, 2009 > worse aches February 20, 2010 so restarted>  stopped April 2013> rx mtx since 12/2011                Objective:   Physical Exam wt 205 Nov 04, 2008 > 201 April 08, 2010 > 204 May 27, 2010 > 205 August 15, 2010> 05/04/2011  208 > 12/14/2011 >195 12/20/2011 > 02/01/2012 199> 05/24/2012  193 > 08/17/13  193> 09/19/2014  185 > 09/22/2015  197   Ambulatory healthy  black female in no acute distress  / vital signs reviewed  HEENT: nl dentition, turbinates, and orophanx. Nl external ear canals without cough reflex  Neck without JVD/Nodes/TM  Lungs clear to A and P bilaterally without cough on insp or exp maneuvers  RRR no s3 or murmur or increase in P2  Abd soft and benign with nl excursion in the supine position. No bruits or organomegaly  Ext warm without calf tenderness, cyanosis clubbing or edema  Skin warm and dry with no significant rash  MS nl gait, no joint restrictions or swelling noted     CXR PA and Lateral:   09/22/2015 :    I personally reviewed images and agree with radiology impression as follows:     No acute cardiopulmonary disease.  Assessment:

## 2016-05-31 ENCOUNTER — Other Ambulatory Visit: Payer: Self-pay | Admitting: Adult Health Nurse Practitioner

## 2016-05-31 DIAGNOSIS — M818 Other osteoporosis without current pathological fracture: Secondary | ICD-10-CM

## 2016-06-25 ENCOUNTER — Other Ambulatory Visit: Payer: BLUE CROSS/BLUE SHIELD

## 2016-09-24 ENCOUNTER — Ambulatory Visit: Payer: BLUE CROSS/BLUE SHIELD | Admitting: Internal Medicine

## 2016-09-27 ENCOUNTER — Ambulatory Visit: Payer: BLUE CROSS/BLUE SHIELD | Admitting: Internal Medicine

## 2016-10-07 ENCOUNTER — Encounter: Payer: Self-pay | Admitting: Internal Medicine

## 2016-10-07 ENCOUNTER — Ambulatory Visit (INDEPENDENT_AMBULATORY_CARE_PROVIDER_SITE_OTHER)
Admission: RE | Admit: 2016-10-07 | Discharge: 2016-10-07 | Disposition: A | Discharge status: Home / Self Care | Payer: BLUE CROSS/BLUE SHIELD | Source: Ambulatory Visit | Attending: Internal Medicine | Admitting: Internal Medicine

## 2016-10-07 ENCOUNTER — Ambulatory Visit (INDEPENDENT_AMBULATORY_CARE_PROVIDER_SITE_OTHER): Payer: BLUE CROSS/BLUE SHIELD | Admitting: Internal Medicine

## 2016-10-07 DIAGNOSIS — D869 Sarcoidosis, unspecified: Secondary | ICD-10-CM | POA: Diagnosis not present

## 2016-10-07 DIAGNOSIS — R058 Other specified cough: Secondary | ICD-10-CM

## 2016-10-07 DIAGNOSIS — R05 Cough: Secondary | ICD-10-CM | POA: Diagnosis not present

## 2016-10-07 NOTE — Patient Instructions (Addendum)
GERD (REFLUX)  is an extremely common cause of respiratory symptoms just like yours , many times with no obvious heartburn at all.    It can be treated with medication, but also with lifestyle changes including elevation of the head of your bed (ideally with 6 inch  bed blocks),  Smoking cessation, avoidance of late meals, excessive alcohol, and avoid fatty foods, chocolate, peppermint, colas, red wine, and acidic juices such as orange juice.  NO MINT OR MENTHOL PRODUCTS SO NO COUGH DROPS   USE SUGARLESS CANDY INSTEAD (Jolley ranchers or Stover's or Life Savers) or even ice chips will also do - the key is to swallow to prevent all throat clearing. NO OIL BASED VITAMINS - use powdered substitutes.  If any throat symptoms worsen on this plan then add  Try prilosec otc   Take 30-60 min before first meal of the day and Pepcid ac (famotidine) 20 mg one @  bedtime for at least 2 weeks and if not better you need to see ENT doctor     Please remember to go to the x-ray department downstairs in the basement  for your tests - we will call you with the results when they are available.   If you are satisfied with your treatment plan,  let your doctor know and he/she can either refill your medications or you can return here when your prescription runs out.     If in any way you are not 100% satisfied,  please tell us.  If 100% better, tell your friends!  Pulmonary follow up is as needed

## 2016-10-07 NOTE — Progress Notes (Signed)
Subjective:     Patient ID: Briana Berry, female   DOB: 04-Jul-1955    MRN: [id number]    Brief patient profile:  35 yobf remote smoker with sarcoidosis chronically that had been prednisone dependent since 1997.Marland Kitchen Historically when tapered of prednisone below 10 mg daily > flare up of either arthritis or cough/sob.  Finally tapered off April 2013    History of Present Illness  12/20/2011 Acute OV / NP/ discuss prednisone . She has been on chronic steroids on /off for last 15 years. Was able to get off for 4 months in 2011 but went back on due to joint aches.  Was weaned down  05/2011, she further tapered herself off 2-3 months ago.  No flare in cough or dyspnea. No rash. No visual issues.  Since off steroids does have joint aches on/off. Esp in knees.  rec Referred  to rheumatology > Dr Dareen Piano d/c prednisone and started mtx effective July 2013   09/19/2014 f/u ov/Briana Berry re: sarcoidosis/ off pred since 2013 / on  mtx per Dr Dareen Piano  Chief Complaint  Patient presents with  . Follow-up    Pt c/o cough "since the weather change"- she states normally has trouble with allergies in the Spring. Her cough is non prod.  Her breathing is doing well.  She states has occ "bumps" on her arms.   already started clariton helps nasal symptoms some/no purulent secretions or sinus pain  Skin changes both elbows wax and wane rec Prednisone 10 mg take  4 each am x 2 days,   2 each am x 2 days,  1 each am x 2 days and stop Be sure to tell Dr Dareen Piano whether the prednisone helped your arthritis or respiratory symptoms even if only temporarily    09/22/2015  f/u ov/Briana Berry re: sarcoid/ mtx dep per Kathi Ludwig now  Chief Complaint  Patient presents with  . Follow-up    Doing well. She had a rash around her neck a few wks ago- resolved after using neosporin.   now doing exercise = walk aerobics x 30 min = 2 miles flat No rash. rec cxr q 12 m unless new sym    10/07/2016  f/u ov/Briana Berry re:  Sarcoid/ mostly joint  issues/ syed rx mtx Chief Complaint  Patient presents with  . Follow-up    Pt has been doing well, she had no concerns as of today  walking track x 30 min x 2 miles s stopping or cough  Throat stays dry x months with sense of globus better if sips water, mostly daytime but can disturb sleep as well  Joints are worse than usual  No gerd rx recently, takes prn and denies overt hb   No obvious day to day or daytime variability or assoc excess/ purulent sputum or mucus plugs or hemoptysis or cp or chest tightness, subjective wheeze or overt sinus or hb symptoms. No unusual exp hx or h/o childhood pna/ asthma or knowledge of premature birth.  Sleeping ok without nocturnal  or early am exacerbation  of respiratory  c/o's or need for noct saba. Also denies any obvious fluctuation of symptoms with weather or environmental changes or other aggravating or alleviating factors except as outlined above   Current Medications, Allergies, Complete Past Medical History, Past Surgical History, Family History, and Social History were reviewed in [employer] record.  ROS  The following are not active complaints unless bolded sore throat, dysphagia, dental problems, itching, sneezing,  nasal congestion  or excess/ purulent secretions, ear ache,   fever, chills, sweats, unintended wt loss, classically pleuritic or exertional cp,  orthopnea pnd or leg swelling, presyncope, palpitations, abdominal pain, anorexia, nausea, vomiting, diarrhea  or change in bowel or bladder habits, change in stools or urine, dysuria,hematuria,  rash, arthralgias, visual complaints, headache, numbness, weakness or ataxia or problems with walking or coordination,  change in mood/affect or memory.                  Past Medical History:  IHD  -left heart catheter 4/94 and 95% LAD reduced to 10% with PCA  -R./S. Myoview negative for ischemia ejection fraction 67% 06/25/04  HBP  SARCOIDOSIS dx by skin  bx......................................................................................Marland KitchenWert / Kathi Ludwig  (Rheum) - Prednisone dep 1997 to April 2013  - Try off prednisone October 13, 2009 > worse aches February 20, 2010 so restarted>  stopped April 2013> rx mtx since 12/2011                Objective:   Physical Exam wt 205 Nov 04, 2008 > 201 April 08, 2010 > 204 May 27, 2010 > 205 August 15, 2010> 05/04/2011  208 > 12/14/2011 >195 12/20/2011 > 02/01/2012 199> 05/24/2012  193 > 08/17/13  193> 09/19/2014  185 > 09/22/2015  197 >   10/07/2016    206    Ambulatory healthy moderately hoarse  black female in no acute distress  / vital signs reviewed - Note on arrival 02 sats  100% on RA    HEENT: nl dentition, turbinates, - very minimal cobblestoning of oropharynx but no evidence decreased moisture . Nl external ear canals without cough reflex  Neck without JVD/Nodes/TM  Lungs clear to A and P bilaterally without cough on insp or exp maneuvers  RRR no s3 or murmur or increase in P2  Abd soft and benign with nl excursion in the supine position. No bruits or organomegaly  Ext warm without calf tenderness, cyanosis clubbing or edema  Skin warm and dry with no significant rash  MS nl gait, no joint restrictions or swelling noted    CXR PA and Lateral:   10/07/2016 :    I personally reviewed images and agree with radiology impression as follows:     No active cardiopulmonary disease.    Assessment:

## 2016-10-10 DIAGNOSIS — R058 Other specified cough: Secondary | ICD-10-CM | POA: Insufficient documentation

## 2016-10-10 DIAGNOSIS — R05 Cough: Secondary | ICD-10-CM | POA: Insufficient documentation

## 2016-10-10 NOTE — Assessment & Plan Note (Signed)
Symptoms of globus are non-specific but benign and the first step is rx by avoiding mint/ menthol and using hard rock candy to avoid throat clearing/ add gerd rx if worsens and f/u with ent prn

## 2016-10-10 NOTE — Assessment & Plan Note (Signed)
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 when ? why  - Plaquenil restarted Nov 04, 2008 > increase to 200 two times a day 09/10/09 > d/c 02/23/10  Due to tingling  100% resolved off it - PFT's January 07, 2009 FVC 97% DLC0 66%  - PFT's 05/24/2012    FVC 2.86 88% DLCO 70 % and corrects to 121 - PFT's 08/17/2013  VC 2.85 (97%) unable to do dlco - MTX started 01/21/12  > f/u Syed/ pulmonary f/u prn as of 10/07/2016    No evidence of active pulmonary sarcoid, joint problems f/u by Kathi Ludwig,  only resp symptoms are upper airway likely related to gerd or sinus dz > see sep a/p   Pulmonary f/u can be prn

## 2018-06-22 IMAGING — DX DG CHEST 2V
2 series · 2 of 2 positions shown · non-contrast
Comparison: 09/22/2015

CLINICAL DATA: Follow-up sarcoid

EXAM:
CHEST  2 VIEW

[chest pa]
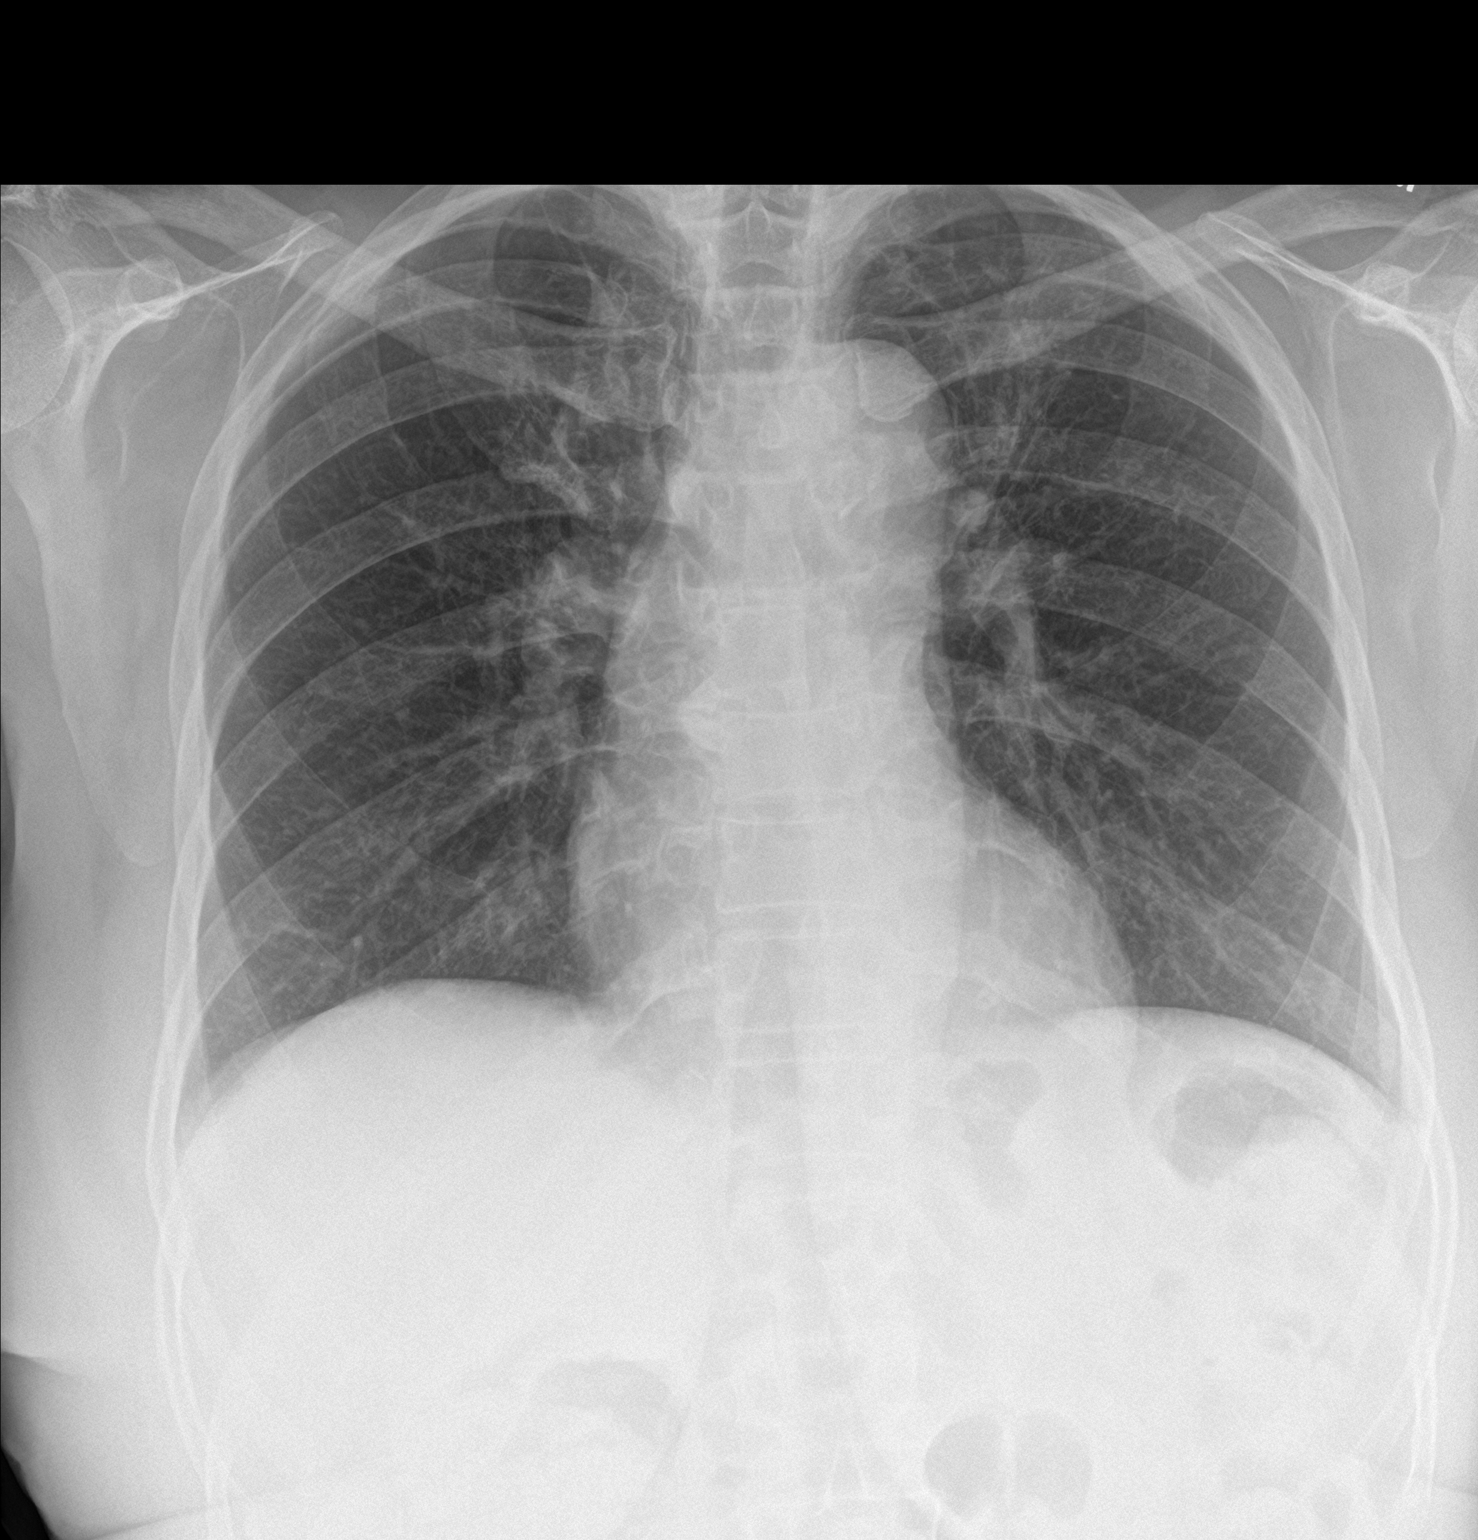

[chest lat]
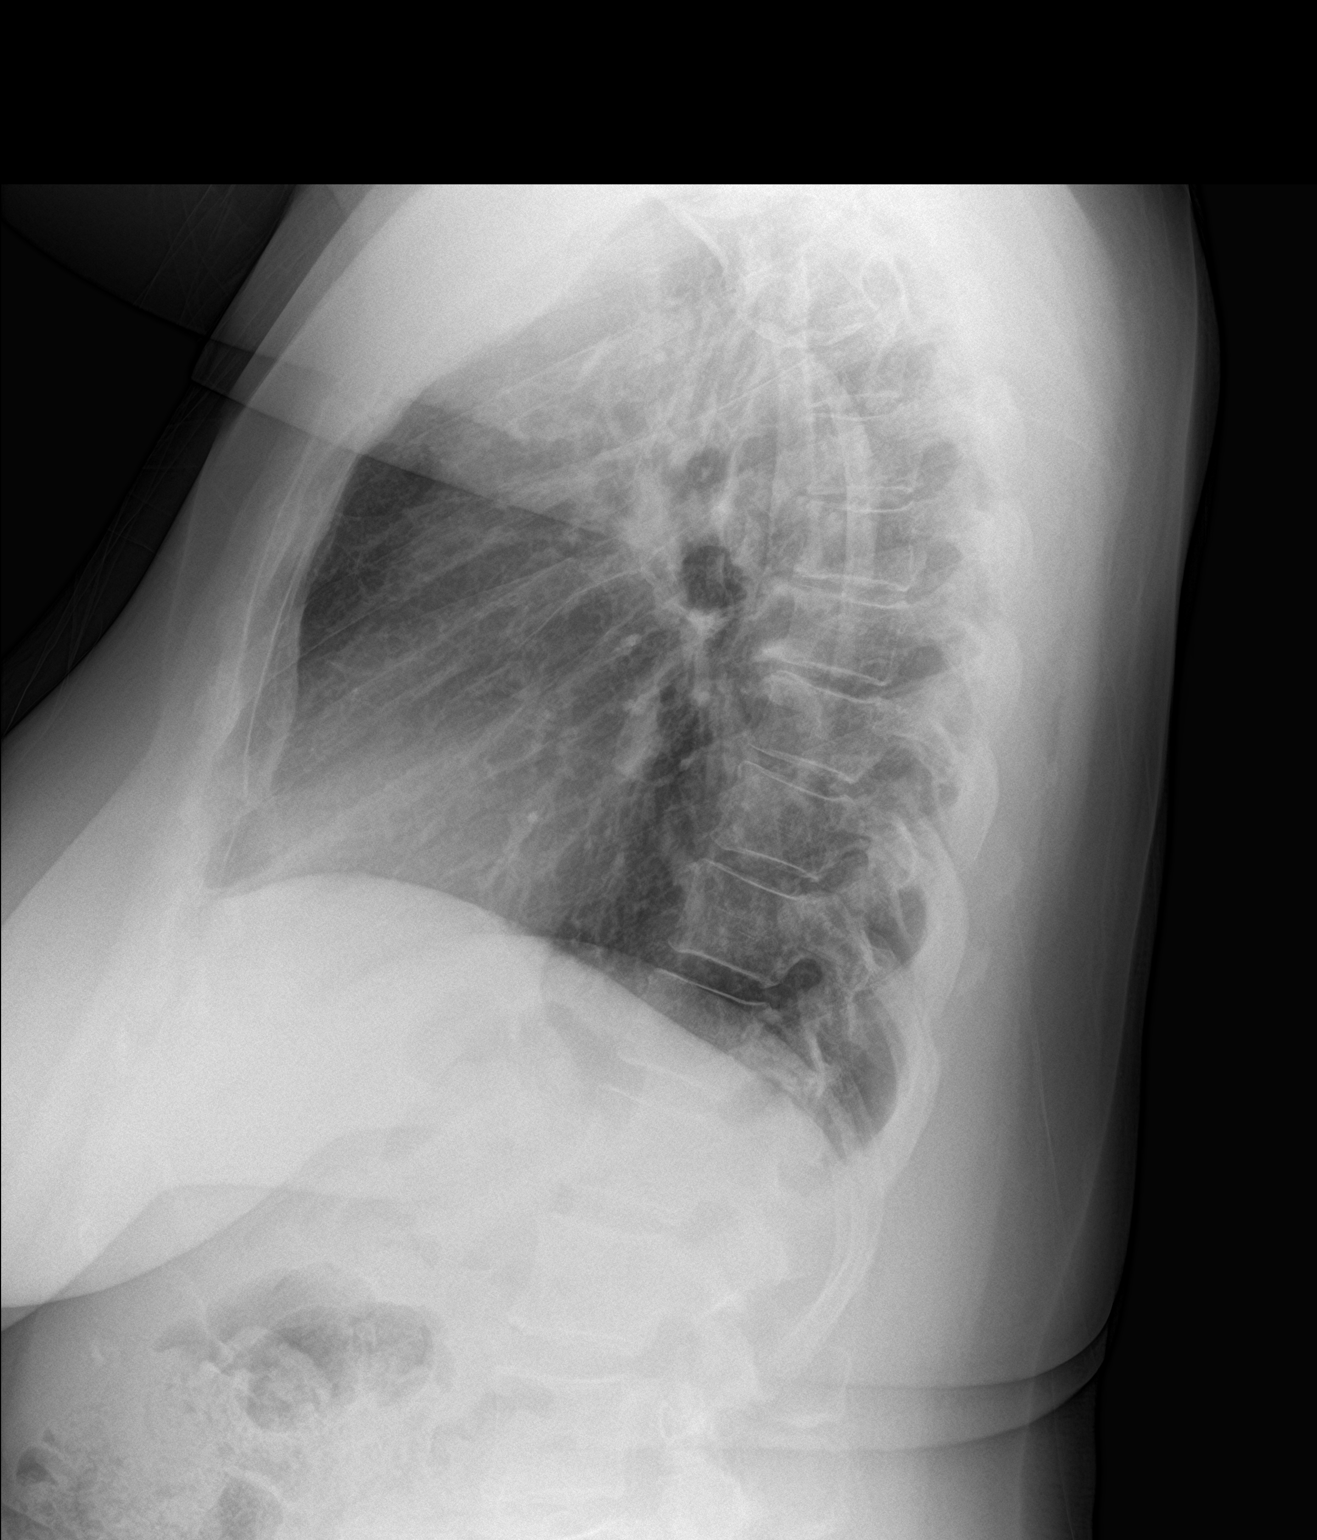

[2 of 2 positions shown; findings below may reference images not displayed]

FINDINGS: Cardiomediastinal silhouette is stable. No infiltrate or pleural
effusion. No pulmonary edema. Mild degenerative changes mid thoracic
spine.
IMPRESSION: No active cardiopulmonary disease.

## 2019-01-23 ENCOUNTER — Other Ambulatory Visit: Payer: Self-pay | Admitting: Adult Health Nurse Practitioner

## 2019-01-23 DIAGNOSIS — Z13820 Encounter for screening for osteoporosis: Secondary | ICD-10-CM

## 2019-01-23 DIAGNOSIS — M8580 Other specified disorders of bone density and structure, unspecified site: Secondary | ICD-10-CM
